# Patient Record
Sex: Male | Born: 1940 | Hispanic: No | Marital: Married | Smoking: Never smoker
Health system: Southern US, Community
[De-identification: ages and names within clinical notes are randomized; demographics above are authoritative.]

## PROBLEM LIST (undated history)

## (undated) DIAGNOSIS — I10 Essential (primary) hypertension: Secondary | ICD-10-CM

## (undated) DIAGNOSIS — K219 Gastro-esophageal reflux disease without esophagitis: Secondary | ICD-10-CM

## (undated) DIAGNOSIS — U071 COVID-19: Secondary | ICD-10-CM

## (undated) DIAGNOSIS — E785 Hyperlipidemia, unspecified: Secondary | ICD-10-CM

## (undated) DIAGNOSIS — I4891 Unspecified atrial fibrillation: Secondary | ICD-10-CM

---

## 2015-02-17 ENCOUNTER — Encounter (HOSPITAL_COMMUNITY): Payer: Self-pay | Admitting: *Deleted

## 2015-02-17 ENCOUNTER — Emergency Department (HOSPITAL_COMMUNITY)
Admission: EM | Admit: 2015-02-17 | Discharge: 2015-02-17 | Disposition: A | Discharge status: Home / Self Care | Payer: Medicaid Other | Attending: Emergency Medicine | Admitting: Emergency Medicine

## 2015-02-17 ENCOUNTER — Emergency Department (HOSPITAL_COMMUNITY): Payer: Medicaid Other

## 2015-02-17 DIAGNOSIS — Y998 Other external cause status: Secondary | ICD-10-CM | POA: Insufficient documentation

## 2015-02-17 DIAGNOSIS — R109 Unspecified abdominal pain: Secondary | ICD-10-CM

## 2015-02-17 DIAGNOSIS — Y9389 Activity, other specified: Secondary | ICD-10-CM | POA: Diagnosis not present

## 2015-02-17 DIAGNOSIS — S3991XA Unspecified injury of abdomen, initial encounter: Secondary | ICD-10-CM | POA: Insufficient documentation

## 2015-02-17 DIAGNOSIS — S20211A Contusion of right front wall of thorax, initial encounter: Secondary | ICD-10-CM | POA: Diagnosis not present

## 2015-02-17 DIAGNOSIS — S199XXA Unspecified injury of neck, initial encounter: Secondary | ICD-10-CM | POA: Insufficient documentation

## 2015-02-17 DIAGNOSIS — R0789 Other chest pain: Secondary | ICD-10-CM

## 2015-02-17 DIAGNOSIS — S3992XA Unspecified injury of lower back, initial encounter: Secondary | ICD-10-CM | POA: Insufficient documentation

## 2015-02-17 DIAGNOSIS — Y92410 Unspecified street and highway as the place of occurrence of the external cause: Secondary | ICD-10-CM | POA: Diagnosis not present

## 2015-02-17 DIAGNOSIS — S299XXA Unspecified injury of thorax, initial encounter: Secondary | ICD-10-CM | POA: Diagnosis present

## 2015-02-17 LAB — I-STAT CHEM 8, ED
BUN: 17 mg/dL (ref 6–20)
Calcium, Ion: 1.22 mmol/L (ref 1.13–1.30)
Chloride: 106 mmol/L (ref 101–111)
Creatinine, Ser: 1 mg/dL (ref 0.61–1.24)
GLUCOSE: 108 mg/dL — AB (ref 65–99)
HCT: 50 % (ref 39.0–52.0)
Hemoglobin: 17 g/dL (ref 13.0–17.0)
POTASSIUM: 3.7 mmol/L (ref 3.5–5.1)
Sodium: 141 mmol/L (ref 135–145)
TCO2: 23 mmol/L (ref 0–100)

## 2015-02-17 LAB — CBC WITH DIFFERENTIAL/PLATELET
Basophils Absolute: 0 10*3/uL (ref 0.0–0.1)
Basophils Relative: 0 % (ref 0–1)
EOS PCT: 1 % (ref 0–5)
Eosinophils Absolute: 0.1 10*3/uL (ref 0.0–0.7)
HCT: 46 % (ref 39.0–52.0)
HEMOGLOBIN: 15.6 g/dL (ref 13.0–17.0)
LYMPHS PCT: 12 % (ref 12–46)
Lymphs Abs: 1.6 10*3/uL (ref 0.7–4.0)
MCH: 28.4 pg (ref 26.0–34.0)
MCHC: 33.9 g/dL (ref 30.0–36.0)
MCV: 83.6 fL (ref 78.0–100.0)
MONO ABS: 1 10*3/uL (ref 0.1–1.0)
MONOS PCT: 7 % (ref 3–12)
Neutro Abs: 10.6 10*3/uL — ABNORMAL HIGH (ref 1.7–7.7)
Neutrophils Relative %: 80 % — ABNORMAL HIGH (ref 43–77)
Platelets: 196 10*3/uL (ref 150–400)
RBC: 5.5 MIL/uL (ref 4.22–5.81)
RDW: 14.5 % (ref 11.5–15.5)
WBC: 13.2 10*3/uL — ABNORMAL HIGH (ref 4.0–10.5)

## 2015-02-17 LAB — BASIC METABOLIC PANEL
ANION GAP: 8 (ref 5–15)
BUN: 14 mg/dL (ref 6–20)
CO2: 24 mmol/L (ref 22–32)
Calcium: 9.7 mg/dL (ref 8.9–10.3)
Chloride: 109 mmol/L (ref 101–111)
Creatinine, Ser: 1.03 mg/dL (ref 0.61–1.24)
GFR calc Af Amer: >60 mL/min (ref >60)
Glucose, Bld: 109 mg/dL — ABNORMAL HIGH (ref 65–99)
Potassium: 3.7 mmol/L (ref 3.5–5.1)
SODIUM: 141 mmol/L (ref 135–145)

## 2015-02-17 MED ORDER — IOHEXOL 300 MG/ML  SOLN
100.0000 mL | Freq: Once | INTRAMUSCULAR | Status: AC | PRN
  Administered 2015-02-17: 100 mL via INTRAVENOUS

## 2015-02-17 MED ORDER — MORPHINE SULFATE 4 MG/ML IJ SOLN
4.0000 mg | Freq: Once | INTRAMUSCULAR | Status: AC
  Administered 2015-02-17: 4 mg via INTRAVENOUS
  Filled 2015-02-17: qty 1

## 2015-02-17 MED ORDER — SODIUM CHLORIDE 0.9 % IV BOLUS (SEPSIS)
500.0000 mL | Freq: Once | INTRAVENOUS | Status: AC
  Administered 2015-02-17: 500 mL via INTRAVENOUS
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
## 2015-02-17 NOTE — ED Notes (Signed)
Pt is in stable condition upon d/c and is escorted from ED via wheelchair. 
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
## 2015-02-17 NOTE — ED Notes (Signed)
Patient transported to CT 

## 2015-02-17 NOTE — Discharge Instructions (Signed)
If you were given medicines take as directed.  If you are on coumadin or contraceptives realize their levels and effectiveness is altered by many different medicines.  If you have any reaction (rash, tongues swelling, other) to the medicines stop taking and see a physician.    If your blood pressure was elevated in the ER make sure you follow up for management with a primary doctor or return for chest pain, shortness of breath or stroke symptoms.  Please follow up as directed and return to the ER or see a physician for new or worsening symptoms.  Thank you. Filed Vitals:   02/17/15 1228 02/17/15 1231 02/17/15 1245 02/17/15 1300  BP:   149/92 141/85  Pulse:   105 92  Temp:  97.7 F (36.5 C)    TempSrc:  Oral    Height:   (1.651 m)    Weight:  160 lb (72.576 kg)    SpO2: 96%  98% 95%

## 2019-04-25 ENCOUNTER — Other Ambulatory Visit: Payer: Self-pay

## 2019-04-25 DIAGNOSIS — Z20822 Contact with and (suspected) exposure to covid-19: Secondary | ICD-10-CM

## 2019-04-26 LAB — NOVEL CORONAVIRUS, NAA: SARS-CoV-2, NAA: NOT DETECTED

## 2019-06-21 ENCOUNTER — Encounter (HOSPITAL_COMMUNITY): Payer: Self-pay | Admitting: Internal Medicine

## 2019-06-21 ENCOUNTER — Inpatient Hospital Stay (HOSPITAL_COMMUNITY)
Admission: EM | Admit: 2019-06-21 | Discharge: 2019-06-26 | DRG: 177 | Disposition: A | Discharge status: Home / Self Care | Payer: Medicaid Other | Attending: Internal Medicine | Admitting: Internal Medicine

## 2019-06-21 ENCOUNTER — Emergency Department (HOSPITAL_COMMUNITY): Payer: Medicaid Other

## 2019-06-21 ENCOUNTER — Other Ambulatory Visit: Payer: Self-pay

## 2019-06-21 DIAGNOSIS — E785 Hyperlipidemia, unspecified: Secondary | ICD-10-CM | POA: Diagnosis present

## 2019-06-21 DIAGNOSIS — E860 Dehydration: Secondary | ICD-10-CM | POA: Diagnosis present

## 2019-06-21 DIAGNOSIS — J1282 Pneumonia due to coronavirus disease 2019: Secondary | ICD-10-CM

## 2019-06-21 DIAGNOSIS — J1289 Other viral pneumonia: Secondary | ICD-10-CM | POA: Diagnosis present

## 2019-06-21 DIAGNOSIS — R0902 Hypoxemia: Secondary | ICD-10-CM

## 2019-06-21 DIAGNOSIS — J069 Acute upper respiratory infection, unspecified: Secondary | ICD-10-CM | POA: Diagnosis present

## 2019-06-21 DIAGNOSIS — Z79899 Other long term (current) drug therapy: Secondary | ICD-10-CM

## 2019-06-21 DIAGNOSIS — K219 Gastro-esophageal reflux disease without esophagitis: Secondary | ICD-10-CM | POA: Diagnosis present

## 2019-06-21 DIAGNOSIS — R001 Bradycardia, unspecified: Secondary | ICD-10-CM | POA: Diagnosis present

## 2019-06-21 DIAGNOSIS — U071 COVID-19: Secondary | ICD-10-CM | POA: Diagnosis present

## 2019-06-21 DIAGNOSIS — I480 Paroxysmal atrial fibrillation: Secondary | ICD-10-CM | POA: Diagnosis not present

## 2019-06-21 DIAGNOSIS — E1169 Type 2 diabetes mellitus with other specified complication: Secondary | ICD-10-CM

## 2019-06-21 DIAGNOSIS — E871 Hypo-osmolality and hyponatremia: Secondary | ICD-10-CM | POA: Diagnosis present

## 2019-06-21 DIAGNOSIS — R0600 Dyspnea, unspecified: Secondary | ICD-10-CM

## 2019-06-21 DIAGNOSIS — I10 Essential (primary) hypertension: Secondary | ICD-10-CM | POA: Diagnosis present

## 2019-06-21 DIAGNOSIS — J189 Pneumonia, unspecified organism: Secondary | ICD-10-CM

## 2019-06-21 DIAGNOSIS — R9431 Abnormal electrocardiogram [ECG] [EKG]: Secondary | ICD-10-CM | POA: Diagnosis not present

## 2019-06-21 DIAGNOSIS — I4891 Unspecified atrial fibrillation: Secondary | ICD-10-CM | POA: Diagnosis present

## 2019-06-21 DIAGNOSIS — J9601 Acute respiratory failure with hypoxia: Secondary | ICD-10-CM | POA: Diagnosis present

## 2019-06-21 HISTORY — DX: Essential (primary) hypertension: I10

## 2019-06-21 HISTORY — DX: Gastro-esophageal reflux disease without esophagitis: K21.9

## 2019-06-21 HISTORY — DX: Hyperlipidemia, unspecified: E78.5

## 2019-06-21 LAB — CBC WITH DIFFERENTIAL/PLATELET
Abs Immature Granulocytes: 0.09 10*3/uL — ABNORMAL HIGH (ref 0.00–0.07)
Basophils Absolute: 0 10*3/uL (ref 0.0–0.1)
Basophils Relative: 1 %
Eosinophils Absolute: 0 10*3/uL (ref 0.0–0.5)
Eosinophils Relative: 0 %
HCT: 51.4 % (ref 39.0–52.0)
Hemoglobin: 16.8 g/dL (ref 13.0–17.0)
Immature Granulocytes: 1 %
Lymphocytes Relative: 18 %
Lymphs Abs: 1.4 10*3/uL (ref 0.7–4.0)
MCH: 27.2 pg (ref 26.0–34.0)
MCHC: 32.7 g/dL (ref 30.0–36.0)
MCV: 83.3 fL (ref 80.0–100.0)
Monocytes Absolute: 0.9 10*3/uL (ref 0.1–1.0)
Monocytes Relative: 12 %
Neutro Abs: 5.1 10*3/uL (ref 1.7–7.7)
Neutrophils Relative %: 68 %
Platelets: 160 10*3/uL (ref 150–400)
RBC: 6.17 MIL/uL — ABNORMAL HIGH (ref 4.22–5.81)
RDW: 13.8 % (ref 11.5–15.5)
WBC: 7.5 10*3/uL (ref 4.0–10.5)
nRBC: 0 % (ref 0.0–0.2)

## 2019-06-21 LAB — COMPREHENSIVE METABOLIC PANEL
ALT: 63 U/L — ABNORMAL HIGH (ref 0–44)
AST: 54 U/L — ABNORMAL HIGH (ref 15–41)
Albumin: 3.7 g/dL (ref 3.5–5.0)
Alkaline Phosphatase: 45 U/L (ref 38–126)
Anion gap: 11 (ref 5–15)
BUN: 21 mg/dL (ref 8–23)
CO2: 18 mmol/L — ABNORMAL LOW (ref 22–32)
Calcium: 8.3 mg/dL — ABNORMAL LOW (ref 8.9–10.3)
Chloride: 100 mmol/L (ref 98–111)
Creatinine, Ser: 0.78 mg/dL (ref 0.61–1.24)
GFR calc Af Amer: >60 mL/min (ref >60)
GFR calc non Af Amer: >60 mL/min (ref >60)
Glucose, Bld: 123 mg/dL — ABNORMAL HIGH (ref 70–99)
Potassium: 3.6 mmol/L (ref 3.5–5.1)
Sodium: 129 mmol/L — ABNORMAL LOW (ref 135–145)
Total Bilirubin: 1.2 mg/dL (ref 0.3–1.2)
Total Protein: 7.6 g/dL (ref 6.5–8.1)

## 2019-06-21 LAB — TRIGLYCERIDES: Triglycerides: 157 mg/dL — ABNORMAL HIGH (ref <150)

## 2019-06-21 LAB — GLUCOSE, CAPILLARY
Glucose-Capillary: 146 mg/dL — ABNORMAL HIGH (ref 70–99)
Glucose-Capillary: 146 mg/dL — ABNORMAL HIGH (ref 70–99)

## 2019-06-21 LAB — D-DIMER, QUANTITATIVE: D-Dimer, Quant: 1 ug/mL-FEU — ABNORMAL HIGH (ref 0.00–0.50)

## 2019-06-21 LAB — FERRITIN: Ferritin: 410 ng/mL — ABNORMAL HIGH (ref 24–336)

## 2019-06-21 LAB — LACTIC ACID, PLASMA: Lactic Acid, Venous: 1.3 mmol/L (ref 0.5–1.9)

## 2019-06-21 LAB — LACTATE DEHYDROGENASE: LDH: 242 U/L — ABNORMAL HIGH (ref 98–192)

## 2019-06-21 LAB — POC SARS CORONAVIRUS 2 AG -  ED: SARS Coronavirus 2 Ag: POSITIVE — AB

## 2019-06-21 LAB — PROCALCITONIN: Procalcitonin: <0.1 ng/mL

## 2019-06-21 LAB — C-REACTIVE PROTEIN: CRP: 8.4 mg/dL — ABNORMAL HIGH (ref <1.0)

## 2019-06-21 LAB — FIBRINOGEN: Fibrinogen: 608 mg/dL — ABNORMAL HIGH (ref 210–475)

## 2019-06-21 LAB — LIPASE, BLOOD: Lipase: 29 U/L (ref 11–51)

## 2019-06-21 MED ORDER — GUAIFENESIN ER 600 MG PO TB12
1200.0000 mg | ORAL_TABLET | Freq: Two times a day (BID) | ORAL | Status: DC
  Administered 2019-06-21 – 2019-06-26 (×10): 1200 mg via ORAL
  Filled 2019-06-21 (×11): qty 2

## 2019-06-21 MED ORDER — MORPHINE SULFATE (PF) 4 MG/ML IV SOLN
4.0000 mg | Freq: Once | INTRAVENOUS | Status: AC
  Administered 2019-06-21: 4 mg via INTRAVENOUS
  Filled 2019-06-21: qty 1

## 2019-06-21 MED ORDER — INSULIN ASPART 100 UNIT/ML ~~LOC~~ SOLN
0.0000 [IU] | Freq: Three times a day (TID) | SUBCUTANEOUS | Status: DC
  Administered 2019-06-21 – 2019-06-22 (×3): 3 [IU] via SUBCUTANEOUS
  Filled 2019-06-21: qty 0.2

## 2019-06-21 MED ORDER — ZINC SULFATE 220 (50 ZN) MG PO CAPS
220.0000 mg | ORAL_CAPSULE | Freq: Every day | ORAL | Status: DC
  Administered 2019-06-21 – 2019-06-26 (×6): 220 mg via ORAL
  Filled 2019-06-21 (×6): qty 1

## 2019-06-21 MED ORDER — METHYLPREDNISOLONE SODIUM SUCC 125 MG IJ SOLR
60.0000 mg | Freq: Two times a day (BID) | INTRAMUSCULAR | Status: DC
  Administered 2019-06-21 – 2019-06-26 (×10): 60 mg via INTRAVENOUS
  Filled 2019-06-21 (×10): qty 2

## 2019-06-21 MED ORDER — SENNOSIDES-DOCUSATE SODIUM 8.6-50 MG PO TABS: 1.0000 | ORAL_TABLET | Freq: Every evening | ORAL | Status: DC | PRN

## 2019-06-21 MED ORDER — ENOXAPARIN SODIUM 40 MG/0.4ML ~~LOC~~ SOLN
40.0000 mg | SUBCUTANEOUS | Status: DC
  Administered 2019-06-21 – 2019-06-25 (×5): 40 mg via SUBCUTANEOUS
  Filled 2019-06-21 (×5): qty 0.4

## 2019-06-21 MED ORDER — TRAMADOL HCL 50 MG PO TABS: 50.0000 mg | ORAL_TABLET | Freq: Four times a day (QID) | ORAL | Status: DC | PRN

## 2019-06-21 MED ORDER — SODIUM CHLORIDE 0.9 % IV SOLN
100.0000 mg | INTRAVENOUS | Status: AC
  Administered 2019-06-22 – 2019-06-25 (×4): 100 mg via INTRAVENOUS
  Filled 2019-06-21 (×2): qty 20
  Filled 2019-06-21 (×2): qty 100

## 2019-06-21 MED ORDER — SODIUM CHLORIDE 0.9 % IV BOLUS
500.0000 mL | Freq: Once | INTRAVENOUS | Status: AC
  Administered 2019-06-21: 500 mL via INTRAVENOUS

## 2019-06-21 MED ORDER — SODIUM CHLORIDE 0.9 % IV SOLN
INTRAVENOUS | Status: DC
  Administered 2019-06-21: 17:00:00 via INTRAVENOUS

## 2019-06-21 MED ORDER — HYDROCOD POLST-CPM POLST ER 10-8 MG/5ML PO SUER: 5.0000 mL | Freq: Two times a day (BID) | ORAL | Status: DC | PRN

## 2019-06-21 MED ORDER — ACETAMINOPHEN 325 MG PO TABS
650.0000 mg | ORAL_TABLET | Freq: Four times a day (QID) | ORAL | Status: DC | PRN
  Administered 2019-06-21 – 2019-06-24 (×3): 650 mg via ORAL
  Filled 2019-06-21 (×3): qty 2

## 2019-06-21 MED ORDER — SODIUM CHLORIDE 0.9 % IV SOLN
INTRAVENOUS | Status: DC
  Administered 2019-06-21: 18:00:00 via INTRAVENOUS

## 2019-06-21 MED ORDER — LORATADINE 10 MG PO TABS: 10.0000 mg | ORAL_TABLET | Freq: Every day | ORAL | Status: DC | PRN

## 2019-06-21 MED ORDER — ONDANSETRON HCL 4 MG/2ML IJ SOLN
4.0000 mg | Freq: Once | INTRAMUSCULAR | Status: AC
  Administered 2019-06-21: 4 mg via INTRAVENOUS
  Filled 2019-06-21: qty 2

## 2019-06-21 MED ORDER — SODIUM CHLORIDE 0.9 % IV SOLN
200.0000 mg | Freq: Once | INTRAVENOUS | Status: AC
  Administered 2019-06-21: 200 mg via INTRAVENOUS
  Filled 2019-06-21: qty 40

## 2019-06-21 MED ORDER — ONDANSETRON HCL 4 MG PO TABS: 4.0000 mg | ORAL_TABLET | Freq: Four times a day (QID) | ORAL | Status: DC | PRN

## 2019-06-21 MED ORDER — SORBITOL 70 % SOLN: 30.0000 mL | Freq: Every day | Status: DC | PRN

## 2019-06-21 MED ORDER — ONDANSETRON HCL 4 MG/2ML IJ SOLN
4.0000 mg | Freq: Four times a day (QID) | INTRAMUSCULAR | Status: DC | PRN
  Filled 2019-06-21: qty 2

## 2019-06-21 MED ORDER — GUAIFENESIN-DM 100-10 MG/5ML PO SYRP
10.0000 mL | ORAL_SOLUTION | ORAL | Status: DC | PRN
  Administered 2019-06-21 – 2019-06-22 (×2): 10 mL via ORAL
  Filled 2019-06-21 (×2): qty 10

## 2019-06-21 MED ORDER — PANTOPRAZOLE SODIUM 40 MG PO TBEC
40.0000 mg | DELAYED_RELEASE_TABLET | Freq: Every day | ORAL | Status: DC
  Administered 2019-06-22 – 2019-06-26 (×5): 40 mg via ORAL
  Filled 2019-06-21 (×5): qty 1

## 2019-06-21 MED ORDER — FENOFIBRATE 160 MG PO TABS
160.0000 mg | ORAL_TABLET | Freq: Every day | ORAL | Status: DC
  Administered 2019-06-21 – 2019-06-26 (×6): 160 mg via ORAL
  Filled 2019-06-21 (×6): qty 1

## 2019-06-21 MED ORDER — DEXAMETHASONE SODIUM PHOSPHATE 10 MG/ML IJ SOLN
8.0000 mg | Freq: Once | INTRAMUSCULAR | Status: AC
  Administered 2019-06-21: 8 mg via INTRAVENOUS
  Filled 2019-06-21: qty 1

## 2019-06-21 MED ORDER — VITAMIN D3 25 MCG (1000 UNIT) PO TABS
1000.0000 [IU] | ORAL_TABLET | Freq: Every day | ORAL | Status: DC
  Administered 2019-06-21 – 2019-06-26 (×6): 1000 [IU] via ORAL
  Filled 2019-06-21 (×9): qty 1

## 2019-06-21 MED ORDER — AMLODIPINE BESYLATE 5 MG PO TABS
2.5000 mg | ORAL_TABLET | Freq: Two times a day (BID) | ORAL | Status: DC
  Administered 2019-06-21 – 2019-06-26 (×9): 2.5 mg via ORAL
  Filled 2019-06-21 (×10): qty 1

## 2019-06-21 MED ORDER — VITAMIN C 500 MG PO TABS
500.0000 mg | ORAL_TABLET | Freq: Every day | ORAL | Status: DC
  Administered 2019-06-21 – 2019-06-26 (×6): 500 mg via ORAL
  Filled 2019-06-21 (×6): qty 1

## 2019-06-21 MED ORDER — IPRATROPIUM-ALBUTEROL 20-100 MCG/ACT IN AERS
1.0000 | INHALATION_SPRAY | Freq: Four times a day (QID) | RESPIRATORY_TRACT | Status: DC
  Administered 2019-06-22 – 2019-06-26 (×13): 1 via RESPIRATORY_TRACT
  Filled 2019-06-21 (×2): qty 4

## 2019-06-21 NOTE — ED Notes (Signed)
Hospitalist at bedside 

## 2019-06-21 NOTE — ED Notes (Signed)
Patient placed on 4L [place] per Dr. Grandville Silos.

## 2019-06-21 NOTE — ED Notes (Signed)
Pt given water and saltines and able to keep it down without difficulty

## 2019-06-21 NOTE — Discharge Instructions (Addendum)
Person Under Monitoring Name: Michael Steele  Location: [place] [address]   Infection Prevention Recommendations for Individuals Confirmed to have, or Being Evaluated for, 2019 Novel Coronavirus (COVID-19) Infection Who Receive Care at Home  Individuals who are confirmed to have, or are being evaluated for, COVID-19 should follow the prevention steps below until a healthcare provider or local or state health department says they can return to normal activities.  Stay home except to get medical care You should restrict activities outside your home, except for getting medical care. Do not go to work, school, or public areas, and do not use public transportation or taxis.  Call ahead before visiting your doctor Before your medical appointment, call the healthcare provider and tell them that you have, or are being evaluated for, COVID-19 infection. This will help the healthcare providers office take steps to keep other people from getting infected. Ask your healthcare provider to call the local or state health department.  Monitor your symptoms Seek prompt medical attention if your illness is worsening (e.g., difficulty breathing). Before going to your medical appointment, call the healthcare provider and tell them that you have, or are being evaluated for, COVID-19 infection. Ask your healthcare provider to call the local or state health department.  Wear a facemask You should wear a facemask that covers your nose and mouth when you are in the same room with other people and when you visit a healthcare provider. People who live with or visit you should also wear a facemask while they are in the same room with you.  Separate yourself from other people in your home As much as possible, you should stay in a different room from other people in your home. Also, you should use a separate bathroom, if available.  Avoid sharing household items You should  not share dishes, drinking glasses, cups, eating utensils, towels, bedding, or other items with other people in your home. After using these items, you should wash them thoroughly with soap and water.  Cover your coughs and sneezes Cover your mouth and nose with a tissue when you cough or sneeze, or you can cough or sneeze into your sleeve. Throw used tissues in a lined trash can, and immediately wash your hands with soap and water for at least 20 seconds or use an alcohol-based hand rub.  Wash your [facility] your hands often and thoroughly with soap and water for at least 20 seconds. You can use an alcohol-based hand sanitizer if soap and water are not available and if your hands are not visibly dirty. Avoid touching your eyes, nose, and mouth with unwashed hands.   Prevention Steps for Caregivers and Household Members of Individuals Confirmed to have, or Being Evaluated for, COVID-19 Infection Being Cared for in the Home  If you live with, or provide care at home for, a person confirmed to have, or being evaluated for, COVID-19 infection please follow these guidelines to prevent infection:  Follow healthcare providers instructions Make sure that you understand and can help the patient follow any healthcare provider instructions for all care.  Provide for the patients basic needs You should help the patient with basic needs in the home and provide support for getting groceries, prescriptions, and other personal needs.  Monitor the patients symptoms If they are getting sicker, call his or her medical provider and tell them that the patient has, or is being evaluated for, COVID-19 infection. This will help the healthcare providers office  take steps to keep other people from getting infected. Ask the healthcare provider to call the local or state health department.  Limit the number of people who have contact with the patient  If possible, have only one caregiver for the  patient.  Other household members should stay in another home or place of residence. If this is not possible, they should stay  in another room, or be separated from the patient as much as possible. Use a separate bathroom, if available.  Restrict visitors who do not have an essential need to be in the home.  Keep older adults, very young children, and other sick people away from the patient Keep older adults, very young children, and those who have compromised immune systems or chronic health conditions away from the patient. This includes people with chronic heart, lung, or kidney conditions, diabetes, and cancer.  Ensure good ventilation Make sure that shared spaces in the home have good air flow, such as from an air conditioner or an opened window, weather permitting.  Wash your hands often  Wash your hands often and thoroughly with soap and water for at least 20 seconds. You can use an alcohol based hand sanitizer if soap and water are not available and if your hands are not visibly dirty.  Avoid touching your eyes, nose, and mouth with unwashed hands.  Use disposable paper towels to dry your hands. If not available, use dedicated cloth towels and replace them when they become wet.  Wear a facemask and gloves  Wear a disposable facemask at all times in the room and gloves when you touch or have contact with the patients blood, body fluids, and/or secretions or excretions, such as sweat, saliva, sputum, nasal mucus, vomit, urine, or feces.  Ensure the mask fits over your nose and mouth tightly, and do not touch it during use.  Throw out disposable facemasks and gloves after using them. Do not reuse.  Wash your hands immediately after removing your facemask and gloves.  If your personal clothing becomes contaminated, carefully remove clothing and launder. Wash your hands after handling contaminated clothing.  Place all used disposable facemasks, gloves, and other waste in a lined  container before disposing them with other household waste.  Remove gloves and wash your hands immediately after handling these items.  Do not share dishes, glasses, or other household items with the patient  Avoid sharing household items. You should not share dishes, drinking glasses, cups, eating utensils, towels, bedding, or other items with a patient who is confirmed to have, or being evaluated for, COVID-19 infection.  After the person uses these items, you should wash them thoroughly with soap and water.  Wash laundry thoroughly  Immediately remove and wash clothes or bedding that have blood, body fluids, and/or secretions or excretions, such as sweat, saliva, sputum, nasal mucus, vomit, urine, or feces, on them.  Wear gloves when handling laundry from the patient.  Read and follow directions on labels of laundry or clothing items and detergent. In general, wash and dry with the warmest temperatures recommended on the label.  Clean all areas the individual has used often  Clean all touchable surfaces, such as counters, tabletops, doorknobs, bathroom fixtures, toilets, phones, keyboards, tablets, and bedside tables, every day. Also, clean any surfaces that may have blood, body fluids, and/or secretions or excretions on them.  Wear gloves when cleaning surfaces the patient has come in contact with.  Use a diluted bleach solution (e.g., dilute bleach with 1 part  bleach and 10 parts water) or a household disinfectant with a label that says EPA-registered for coronaviruses. To make a bleach solution at home, add 1 tablespoon of bleach to 1 quart (4 cups) of water. For a larger supply, add  cup of bleach to 1 gallon (16 cups) of water.  Read labels of cleaning products and follow recommendations provided on product labels. Labels contain instructions for safe and effective use of the cleaning product including precautions you should take when applying the product, such as wearing gloves or  eye protection and making sure you have good ventilation during use of the product.  Remove gloves and wash hands immediately after cleaning.  Monitor yourself for signs and symptoms of illness Caregivers and household members are considered close contacts, should monitor their health, and will be asked to limit movement outside of the home to the extent possible. Follow the monitoring steps for close contacts listed on the symptom monitoring form.   ? If you have additional questions, contact your local health department or call the epidemiologist on call at [phone] (available 24/7). ? This guidance is subject to change. For the most up-to-date guidance from [facility], please refer to their website: [email or web address]

## 2019-06-21 NOTE — H&P (Signed)
History and Physical    Michael Steele QPR:[id number] DOB: 1941/05/19 DOA: 06/21/2019  PCP: [facility] (Inactive) Patient coming from: Home  I have personally briefly reviewed patient's old medical records in [facility]  Chief Complaint: Worsening shortness of breath/recently diagnosed with Covid on 06/14/2019 per patient and wife.   HPI: Michael Steele is a 78 y.o. male with medical history significant of hypertension, hyperlipidemia, GERD, recently diagnosed with Covid 19+ per patient 06/14/2019 at [facility] per ED physician. Patient's wife feels he got exposed on 12 or 13 of November as patient's daughter came to visit from school in [place] and returned back to school in [place] where she tested positive for COVID-19 on 06/11/2019. Patient stated initial 5 days he remained pretty much asymptomatic with no significant issues however over the past 4-5 days has had gradual worsening shortness of breath,fatigue, increased work of breathing, epigastric abdominal pain, nonproductive cough.  Patient denies any fevers, no chills, no nausea or vomiting, no diarrhea constipation, no dizziness, no lightheadedness, no dysuria, no melena, no hematemesis, no hematochezia.  Patient does state that his abdominal pain that he had on presentation has currently resolved in the ED.  Patient stated initially had some chest pain however this has subsequently since resolved.  ED Course: Patient seen in the ED, noted to be tachycardic with tachypnea with a respiratory rate of 36, patient also noted to have sats of 86 to 87% on room air with minimal activity with shortness of breath on minimal activity and increased work of breathing on minimal exertion.  Chest x-ray done concerning for left lower lobe pneumonia.  Comprehensive metabolic profile with a sodium of 129, bicarb of 18, glucose of 123, calcium of 8.3, AST of 54, ALT of 63.  LDH noted to be elevated at 242.  Triglycerides are  157.  Ferritin elevated at 410, CRP elevated at 8.4.  D-dimer elevated at 1.  Procalcitonin less than 0.10.  Fibrinogen elevated at 608.  CBC unremarkable.  Point-of-care COVID-19 test positive.  SARS Covid 2 pending.  Review of Systems: As per HPI otherwise 10 point review of systems negative.   Past Medical History:  Diagnosis Date   GERD (gastroesophageal reflux disease)    Hyperlipidemia    Hypertension     No past surgical history on file.   reports that he has never smoked. He does not have any smokeless tobacco history on file. He reports current alcohol use. He reports that he does not use drugs.  No Known Allergies  No family history on file.   Per patient mother died at age 6/76 from old age.  Father died in his 72s from old age.  Prior to Admission medications   Medication Sig Start Date End Date Taking? Authorizing Provider  amLODipine (NORVASC) 2.5 MG tablet Take 2.5 mg by mouth 2 (two) times daily. 06/07/19  Yes [provider]  Ascorbic Acid (VITAMIN C PO) Take 1 tablet by mouth daily.   Yes [provider]  azithromycin (ZITHROMAX) 250 MG tablet Take 250-500 mg by mouth See admin instructions. Take 500mg  on day 1, then 250mg  on days 2-5. 06/14/19  Yes [provider]  cetirizine (ZYRTEC) 5 MG tablet Take 5 mg by mouth daily as needed for allergies.   Yes [provider]  omeprazole (PRILOSEC) 20 MG capsule Take 20 mg by mouth daily. 05/25/19  Yes [provider]  predniSONE (DELTASONE) 20 MG tablet Take 20 mg by  mouth daily. 06/19/19  Yes [provider]  VITAMIN D PO Take 1 capsule by mouth daily.   Yes [provider]  fenofibrate (TRICOR) 48 MG tablet Take 48 mg by mouth daily. 06/07/19   [provider]    Physical Exam: Vitals:   06/21/19 1600 06/21/19 1630 06/21/19 1657 06/21/19 1700  BP: 130/81 129/80  117/81  Pulse: 97 89 84 83  Resp: (!) 26 (!) 24 20 (!) 23  Temp:      TempSrc:       SpO2: 94% 94% 96% 96%    Constitutional: NAD, calm, comfortable Vitals:   06/21/19 1600 06/21/19 1630 06/21/19 1657 06/21/19 1700  BP: 130/81 129/80  117/81  Pulse: 97 89 84 83  Resp: (!) 26 (!) 24 20 (!) 23  Temp:      TempSrc:      SpO2: 94% 94% 96% 96%   Eyes: PERRL, lids and conjunctivae normal ENMT: Mucous membranes are dry. Posterior pharynx clear of any exudate or lesions.Normal dentition.  Neck: normal, supple, no masses, no thyromegaly Respiratory: Poor to fair air movement.  Diminished breath sounds in the bases.  Some coarse breath sounds in the left base.  No wheezing noted.  No crackles noted.  Speaking in full sentences.  No significant use of accessory muscles of respiration at rest.  Cardiovascular:.  No murmurs rubs or gallops.  No JVD.  No lower extremity edema.  No carotid bruits.  Abdomen: no tenderness, no masses palpated. No hepatosplenomegaly. Bowel sounds positive.  Musculoskeletal: no clubbing / cyanosis. No joint deformity upper and lower extremities. Good ROM, no contractures. Normal muscle tone.  Skin: no rashes, lesions, ulcers. No induration Neurologic: CN 2-12 grossly intact. Sensation intact, DTR normal. Strength 5/5 in all 4.  Psychiatric: Normal judgment and insight. Alert and oriented x 3. Normal mood.   Labs on Admission: I have personally reviewed following labs and imaging studies  CBC: Recent Labs  Lab 06/21/19 1146  WBC 7.5  NEUTROABS 5.1  HGB 16.8  HCT 51.4  MCV 83.3  PLT 160   Basic Metabolic Panel: Recent Labs  Lab 06/21/19 1146  NA 129*  K 3.6  CL 100  CO2 18*  GLUCOSE 123*  BUN 21  CREATININE 0.78  CALCIUM 8.3*   GFR: CrCl cannot be calculated (Unknown ideal weight.). Liver Function Tests: Recent Labs  Lab 06/21/19 1146  AST 54*  ALT 63*  ALKPHOS 45  BILITOT 1.2  PROT 7.6  ALBUMIN 3.7   Recent Labs  Lab 06/21/19 1146  LIPASE 29   No results for input(s): AMMONIA in the last 168 hours. Coagulation  Profile: No results for input(s): INR, PROTIME in the last 168 hours. Cardiac Enzymes: No results for input(s): CKTOTAL, CKMB, CKMBINDEX, TROPONINI in the last 168 hours. BNP (last 3 results) No results for input(s): PROBNP in the last 8760 hours. HbA1C: No results for input(s): HGBA1C in the last 72 hours. CBG: No results for input(s): GLUCAP in the last 168 hours. Lipid Profile: Recent Labs    06/21/19 1146  TRIG 157*   Thyroid Function Tests: No results for input(s): TSH, T4TOTAL, FREET4, T3FREE, THYROIDAB in the last 72 hours. Anemia Panel: Recent Labs    06/21/19 1146  FERRITIN 410*   Urine analysis: No results found for: COLORURINE, APPEARANCEUR, LABSPEC, PHURINE, GLUCOSEU, HGBUR, BILIRUBINUR, KETONESUR, PROTEINUR, UROBILINOGEN, NITRITE, LEUKOCYTESUR  Radiological Exams on Admission: Dg Chest Portable 1 View  Result Date: 06/21/2019 CLINICAL DATA:  Patient presents  to the ED with complaint of SOB. Patient is reportedly having orthopnea that is relieved by sitting up. Patient reports he was started on prednisone and antibiotics but self stopped 3 days in. Patient reports he COVID positive COVID+, SOB EXAM: PORTABLE CHEST 1 VIEW COMPARISON:  Chest radiograph 03/03/2015 FINDINGS: Stable cardiac silhouette. There is fine airspace disease in the LEFT lower lobe. RIGHT lung clear. LEFT upper lobe clear. No pneumothorax. No pulmonary edema IMPRESSION: LEFT lower lobe pneumonia. Electronically Signed   By: Suzy Bouchard M.D.   On: 06/21/2019 11:54    EKG: Independently reviewed.  Sinus tachycardia with occasional PVCs.  No ischemic changes noted.  Assessment/Plan Principal Problem:   Acute hypoxemic respiratory failure due to COVID-19 [facility]) Active Problems:   Acute respiratory disease due to COVID-19 virus   Hyponatremia   Dehydration   HTN (hypertension)   Hyperlipidemia   GERD (gastroesophageal reflux disease)   1 acute hypoxemic respiratory failure due to COVID-19  pneumonitis/pneumonia Patient tested positive for Covid 19 on 06/14/2019 and wife at [facility].  Per wife patient likely may have been exposed on November 12 or 13 2020 as daughter came to visit from school and returned back to [place] where she tested positive for COVID-19 on June 11, 2019. Unable to obtain results from [facility] on day of admission.  Patient presented with a 5-day history of worsening shortness of breath, increased work of breathing, nonproductive cough, hypoxic with sats of 80s on minimal exertion and with increased work of breathing on minimal exertion.  D-dimer elevated, CRP elevated, LDH elevated, ferritin elevated.  Procalcitonin less than 0.10.  Point-of-care SARS-CoV-2 positive.  SARS-CoV-2 PCR pending.  Chest x-ray with left lower lobe infiltrate.  Patient received a dose of IV Decadron in the ED.  Will place on Solu-Medrol 60 mg IV every 12 hours, IV remdesivir, Mucinex, Combivent MDI every 6 hours, vitamin C, zinc, IV fluids, supportive care.  Pulmonary toilet.  2.  Hypertension Resume home regimen of Norvasc 2.5 mg daily.  3.  Dehydration IV fluids.  4.  Gastroesophageal reflux disease PPI.  5.  Hyperlipidemia Resume home regimen of fenofibrate.  6.  Hyponatremia Likely secondary to volume depletion.  Hydrated IV fluids.  Follow.  DVT prophylaxis: Lovenox Code Status: Full Family Communication: Updated patient.  Updated wife via telephone.   Disposition Plan: Likely home when clinically improved. Consults called: None Admission status: Admit to inpatient/airborne and contact precautions/telemetry  Time spent 60 minutes   Irine Seal MD [facility]  If 7PM-7AM, please contact night-coverage [email or web address]  06/21/2019, 5:25 PM

## 2019-06-21 NOTE — ED Provider Notes (Addendum)
[facility]-EMERGENCY DEPT Provider Note   CSN: [id number] Arrival date & time: 06/21/19  1051     History   Chief Complaint Chief Complaint  Patient presents with  . Shortness of Breath  . COVID +    HPI Michael Steele is a 78 y.o. male.     Patient with recent covid+ test, and covid symptoms for past 10 days, c/o increased wob, and epigastric pain. Symptoms acute onset, moderate, persistent, felt worse in past 1-2 days. Nausea. No vomiting. Had normal bm this AM. Denies abd distension. Epigastric pain, moderate, constant, persistent, non radiating. No dysuria ur gu c/o. Episodic cough, persistent, not acutely worse, non productive. No back or flank pain.   The history is provided by the patient.  Shortness of Breath Associated symptoms: abdominal pain, cough and fever   Associated symptoms: no chest pain, no headaches, no neck pain, no rash and no sore throat     No past medical history on file.  There are no active problems to display for this patient.   No past surgical history on file.      Home Medications    Prior to Admission medications   Not on File    Family History No family history on file.  Social History Social History   Tobacco Use  . Smoking status: Not on file  Substance Use Topics  . Alcohol use: Not on file  . Drug use: Not on file     Allergies   Patient has no known allergies.   Review of Systems Review of Systems  Constitutional: Positive for fever.  HENT: Negative for sore throat.   Eyes: Negative for redness.  Respiratory: Positive for cough and shortness of breath.   Cardiovascular: Negative for chest pain.  Gastrointestinal: Positive for abdominal pain and nausea.  Endocrine: Negative for polyuria.  Genitourinary: Negative for dysuria and flank pain.  Musculoskeletal: Negative for back pain, neck pain and neck stiffness.  Skin: Negative for rash.  Neurological: Negative for headaches.   Hematological: Does not bruise/bleed easily.  Psychiatric/Behavioral: Negative for confusion.     Physical Exam Updated Vital Signs BP (!) 139/93   Pulse 94   Temp 98.7 F (37.1 C) (Oral)   Resp (!) 32   SpO2 95%   Physical Exam Vitals signs and nursing note reviewed.  Constitutional:      Appearance: Normal appearance. He is well-developed.  HENT:     Head: Atraumatic.     Nose: Nose normal.     Mouth/Throat:     Mouth: Mucous membranes are moist.     Pharynx: Oropharynx is clear.  Eyes:     General: No scleral icterus.    Conjunctiva/sclera: Conjunctivae normal.  Neck:     Musculoskeletal: Normal range of motion and neck supple. No neck rigidity.     Trachea: No tracheal deviation.  Cardiovascular:     Rate and Rhythm: Normal rate and regular rhythm.     Pulses: Normal pulses.     Heart sounds: Normal heart sounds. No murmur. No friction rub. No gallop.   Pulmonary:     Effort: Pulmonary effort is normal. No accessory muscle usage or respiratory distress.     Breath sounds: Normal breath sounds.  Abdominal:     General: Bowel sounds are normal. There is no distension.     Palpations: Abdomen is soft. There is no mass.     Tenderness: There is no abdominal tenderness. There is no guarding  or rebound.     Hernia: No hernia is present.  Genitourinary:    Comments: No cva tenderness. Musculoskeletal:        General: No swelling or tenderness.  Skin:    General: Skin is warm and dry.     Findings: No rash.  Neurological:     Mental Status: He is alert.     Comments: Alert, speech clear.   Psychiatric:        Mood and Affect: Mood normal.      ED Treatments / Results  Labs (all labs ordered are listed, but only abnormal results are displayed) Results for orders placed or performed during the hospital encounter of 06/21/19  Lactic acid, plasma  Result Value Ref Range   Lactic Acid, Venous 1.3 0.5 - 1.9 mmol/L  CBC WITH DIFFERENTIAL  Result Value Ref Range    WBC 7.5 4.0 - 10.5 K/uL   RBC 6.17 (H) 4.22 - 5.81 MIL/uL   Hemoglobin 16.8 13.0 - 17.0 g/dL   HCT 51.4 39.0 - 52.0 %   MCV 83.3 80.0 - 100.0 fL   MCH 27.2 26.0 - 34.0 pg   MCHC 32.7 30.0 - 36.0 g/dL   RDW 13.8 11.5 - 15.5 %   Platelets 160 150 - 400 K/uL   nRBC 0.0 0.0 - 0.2 %   Neutrophils Relative % 68 %   Neutro Abs 5.1 1.7 - 7.7 K/uL   Lymphocytes Relative 18 %   Lymphs Abs 1.4 0.7 - 4.0 K/uL   Monocytes Relative 12 %   Monocytes Absolute 0.9 0.1 - 1.0 K/uL   Eosinophils Relative 0 %   Eosinophils Absolute 0.0 0.0 - 0.5 K/uL   Basophils Relative 1 %   Basophils Absolute 0.0 0.0 - 0.1 K/uL   Immature Granulocytes 1 %   Abs Immature Granulocytes 0.09 (H) 0.00 - 0.07 K/uL  Comprehensive metabolic panel  Result Value Ref Range   Sodium 129 (L) 135 - 145 mmol/L   Potassium 3.6 3.5 - 5.1 mmol/L   Chloride 100 98 - 111 mmol/L   CO2 18 (L) 22 - 32 mmol/L   Glucose, Bld 123 (H) 70 - 99 mg/dL   BUN 21 8 - 23 mg/dL   Creatinine, Ser 0.78 0.61 - 1.24 mg/dL   Calcium 8.3 (L) 8.9 - 10.3 mg/dL   Total Protein 7.6 6.5 - 8.1 g/dL   Albumin 3.7 3.5 - 5.0 g/dL   AST 54 (H) 15 - 41 U/L   ALT 63 (H) 0 - 44 U/L   Alkaline Phosphatase 45 38 - 126 U/L   Total Bilirubin 1.2 0.3 - 1.2 mg/dL   GFR calc non Af Amer >60 >60 mL/min   GFR calc Af Amer >60 >60 mL/min   Anion gap 11 5 - 15  D-dimer, quantitative  Result Value Ref Range   D-Dimer, Quant 1.00 (H) 0.00 - 0.50 ug/mL-FEU  Procalcitonin  Result Value Ref Range   Procalcitonin <0.10 ng/mL  Lactate dehydrogenase  Result Value Ref Range   LDH 242 (H) 98 - 192 U/L  Ferritin  Result Value Ref Range   Ferritin 410 (H) 24 - 336 ng/mL  Triglycerides  Result Value Ref Range   Triglycerides 157 (H) <150 mg/dL  Fibrinogen  Result Value Ref Range   Fibrinogen 608 (H) 210 - 475 mg/dL  C-reactive protein  Result Value Ref Range   CRP 8.4 (H) <1.0 mg/dL  Lipase, blood  Result Value Ref Range   Lipase  29 11 - 51 U/L   Dg Chest  Portable 1 View  Result Date: 06/21/2019 CLINICAL DATA:  Patient presents to the ED with complaint of SOB. Patient is reportedly having orthopnea that is relieved by sitting up. Patient reports he was started on prednisone and antibiotics but self stopped 3 days in. Patient reports he COVID positive COVID+, SOB EXAM: PORTABLE CHEST 1 VIEW COMPARISON:  Chest radiograph 03/03/2015 FINDINGS: Stable cardiac silhouette. There is fine airspace disease in the LEFT lower lobe. RIGHT lung clear. LEFT upper lobe clear. No pneumothorax. No pulmonary edema IMPRESSION: LEFT lower lobe pneumonia. Electronically Signed   By: Genevive BiStewart  Edmunds M.D.   On: 06/21/2019 11:54    EKG EKG Interpretation  Date/Time:  Thursday June 21 2019 11:14:40 EST Ventricular Rate:  111 PR Interval:    QRS Duration: 99 QT Interval:  359 QTC Calculation: 463 R Axis:   -31 Text Interpretation: Sinus tachycardia Multiple premature complexes, vent & supraven No previous tracing Confirmed by Cathren LaineSteinl, Envi Eagleson ([id number]) on 06/21/2019 11:34:43 AM   Radiology Dg Chest Portable 1 View  Result Date: 06/21/2019 CLINICAL DATA:  Patient presents to the ED with complaint of SOB. Patient is reportedly having orthopnea that is relieved by sitting up. Patient reports he was started on prednisone and antibiotics but self stopped 3 days in. Patient reports he COVID positive COVID+, SOB EXAM: PORTABLE CHEST 1 VIEW COMPARISON:  Chest radiograph 03/03/2015 FINDINGS: Stable cardiac silhouette. There is fine airspace disease in the LEFT lower lobe. RIGHT lung clear. LEFT upper lobe clear. No pneumothorax. No pulmonary edema IMPRESSION: LEFT lower lobe pneumonia. Electronically Signed   By: Genevive BiStewart  Edmunds M.D.   On: 06/21/2019 11:54    Procedures Procedures (including critical care time)  Medications Ordered in ED Medications  morphine 4 MG/ML injection 4 mg (has no administration in time range)  ondansetron (ZOFRAN) injection 4 mg (has no  administration in time range)     Initial Impression / Assessment and Plan / ED Course  I have reviewed the triage vital signs and the nursing notes.  Pertinent labs & imaging results that were available during my care of the patient were reviewed by me and considered in my medical decision making (see chart for details).  Iv ns. Labs. Cxr.   Reviewed nursing notes and prior charts for additional history.   Pt requests pain medication. Morphine iv. zofran iv.   Labs reviewed/interpreted by me - lactate normal. Wbc normal.   Xrays reviewed/interpreted by me - ?viral pna. C/w covid.   Recheck pt, denies abd pain. No chest pain. Is breathing comfortably and is not toxic appearing. Pt reports feeling much improved.  Recheck abd soft nt.   Recheck pulse ox 89 % while in bed/sitting up  With minimal activity in room, walking around stretcher, pulse ox is 86-87%, and patient quite dyspneic. Resting rr is also high, with increased wob and covid pna on cxr - will consult hospitalists for admission. 2 liters [place] - sat 93%.   Discussed w hospitalists, Dr Janee MornHompson - will admit, as covid test not showing up in system ([facility] he requests repeat covid testing here - sent.   Emelda FearFouad Al Rifaie was evaluated in Emergency Department on 06/21/2019 for the symptoms described in the history of present illness. He was evaluated in the context of the global COVID-19 pandemic, which necessitated consideration that the patient might be at risk for infection with the SARS-CoV-2 virus that causes COVID-19. Institutional protocols  and algorithms that pertain to the evaluation of patients at risk for COVID-19 are in a state of rapid change based on information released by regulatory bodies including the CDC and federal and state organizations. These policies and algorithms were followed during the patient's care in the ED.   Final Clinical Impressions(s) / ED Diagnoses   Final diagnoses:  None     ED Discharge Orders    None         Cathren Laine, MD 06/21/19 1506    Cathren Laine, MD 06/21/19 1530

## 2019-06-21 NOTE — ED Triage Notes (Signed)
Patient presents to the ED with complaint of SOB. Patient is reportedly having orthopnea that is relieved by sitting up. Patient reports he was started on prednisone and antibiotics but self stopped 3 days in. Patient reports he is COVID positive and on his 10th day on the illness.

## 2019-06-22 ENCOUNTER — Other Ambulatory Visit: Payer: Self-pay

## 2019-06-22 ENCOUNTER — Inpatient Hospital Stay (HOSPITAL_COMMUNITY): Payer: Medicaid Other

## 2019-06-22 ENCOUNTER — Encounter (HOSPITAL_COMMUNITY): Payer: Self-pay

## 2019-06-22 DIAGNOSIS — R9431 Abnormal electrocardiogram [ECG] [EKG]: Secondary | ICD-10-CM

## 2019-06-22 DIAGNOSIS — I4891 Unspecified atrial fibrillation: Secondary | ICD-10-CM | POA: Diagnosis not present

## 2019-06-22 LAB — CBC WITH DIFFERENTIAL/PLATELET
Abs Immature Granulocytes: 0.06 10*3/uL (ref 0.00–0.07)
Basophils Absolute: 0 10*3/uL (ref 0.0–0.1)
Basophils Relative: 0 %
Eosinophils Absolute: 0 10*3/uL (ref 0.0–0.5)
Eosinophils Relative: 0 %
HCT: 45.8 % (ref 39.0–52.0)
Hemoglobin: 14.7 g/dL (ref 13.0–17.0)
Immature Granulocytes: 2 %
Lymphocytes Relative: 41 %
Lymphs Abs: 1.3 10*3/uL (ref 0.7–4.0)
MCH: 27.1 pg (ref 26.0–34.0)
MCHC: 32.1 g/dL (ref 30.0–36.0)
MCV: 84.3 fL (ref 80.0–100.0)
Monocytes Absolute: 0.5 10*3/uL (ref 0.1–1.0)
Monocytes Relative: 15 %
Neutro Abs: 1.4 10*3/uL — ABNORMAL LOW (ref 1.7–7.7)
Neutrophils Relative %: 42 %
Platelets: 158 10*3/uL (ref 150–400)
RBC: 5.43 MIL/uL (ref 4.22–5.81)
RDW: 13.8 % (ref 11.5–15.5)
WBC: 3.2 10*3/uL — ABNORMAL LOW (ref 4.0–10.5)
nRBC: 0 % (ref 0.0–0.2)

## 2019-06-22 LAB — COMPREHENSIVE METABOLIC PANEL
ALT: 47 U/L — ABNORMAL HIGH (ref 0–44)
AST: 38 U/L (ref 15–41)
Albumin: 2.9 g/dL — ABNORMAL LOW (ref 3.5–5.0)
Alkaline Phosphatase: 39 U/L (ref 38–126)
Anion gap: 10 (ref 5–15)
BUN: 19 mg/dL (ref 8–23)
CO2: 20 mmol/L — ABNORMAL LOW (ref 22–32)
Calcium: 7.8 mg/dL — ABNORMAL LOW (ref 8.9–10.3)
Chloride: 107 mmol/L (ref 98–111)
Creatinine, Ser: 0.83 mg/dL (ref 0.61–1.24)
GFR calc Af Amer: >60 mL/min (ref >60)
GFR calc non Af Amer: >60 mL/min (ref >60)
Glucose, Bld: 140 mg/dL — ABNORMAL HIGH (ref 70–99)
Potassium: 4.3 mmol/L (ref 3.5–5.1)
Sodium: 137 mmol/L (ref 135–145)
Total Bilirubin: 0.5 mg/dL (ref 0.3–1.2)
Total Protein: 6.2 g/dL — ABNORMAL LOW (ref 6.5–8.1)

## 2019-06-22 LAB — D-DIMER, QUANTITATIVE: D-Dimer, Quant: 0.69 ug/mL-FEU — ABNORMAL HIGH (ref 0.00–0.50)

## 2019-06-22 LAB — GLUCOSE, CAPILLARY
Glucose-Capillary: 128 mg/dL — ABNORMAL HIGH (ref 70–99)
Glucose-Capillary: 142 mg/dL — ABNORMAL HIGH (ref 70–99)
Glucose-Capillary: 163 mg/dL — ABNORMAL HIGH (ref 70–99)
Glucose-Capillary: 166 mg/dL — ABNORMAL HIGH (ref 70–99)

## 2019-06-22 LAB — ECHOCARDIOGRAM COMPLETE
Height: 65 in
Weight: 2620.83 oz

## 2019-06-22 LAB — TYPE AND SCREEN
ABO/RH(D): B POS
Antibody Screen: NEGATIVE

## 2019-06-22 LAB — FERRITIN: Ferritin: 392 ng/mL — ABNORMAL HIGH (ref 24–336)

## 2019-06-22 LAB — C-REACTIVE PROTEIN: CRP: 7.4 mg/dL — ABNORMAL HIGH (ref <1.0)

## 2019-06-22 LAB — PHOSPHORUS: Phosphorus: 3.5 mg/dL (ref 2.5–4.6)

## 2019-06-22 LAB — TSH: TSH: 0.356 u[IU]/mL (ref 0.350–4.500)

## 2019-06-22 LAB — ABO/RH: ABO/RH(D): B POS

## 2019-06-22 LAB — MAGNESIUM: Magnesium: 2.2 mg/dL (ref 1.7–2.4)

## 2019-06-22 MED ORDER — DIPHENHYDRAMINE HCL 25 MG PO CAPS
25.0000 mg | ORAL_CAPSULE | Freq: Once | ORAL | Status: AC
  Administered 2019-06-23: 25 mg via ORAL
  Filled 2019-06-22: qty 1

## 2019-06-22 MED ORDER — METOPROLOL TARTRATE 5 MG/5ML IV SOLN: 5.0000 mg | INTRAVENOUS | Status: DC | PRN

## 2019-06-22 MED ORDER — METOPROLOL TARTRATE 25 MG PO TABS
25.0000 mg | ORAL_TABLET | Freq: Two times a day (BID) | ORAL | Status: DC
  Administered 2019-06-22: 25 mg via ORAL
  Filled 2019-06-22: qty 1

## 2019-06-22 NOTE — Progress Notes (Signed)
PROGRESS NOTE    Michael Steele  WUJ:[id number] DOB: [birth year] DOA: 06/21/2019 PCP: Inc, [facility] (Inactive)    Brief Narrative:  78 year old gentleman with history of hypertension, hyperlipidemia, GERD who was recently diagnosed with COVID-19 infection on 06/14/2019.  Patient presented to the emergency room with about 4 to 5 days of gradually worsening shortness of breath, fatigue and increased work of breathing as well as nonproductive cough.  No fever. In the emergency room patient noted to be tachycardic with tachypnea, respiratory rate 36.  Saturation 86% on room air.  Chest x-ray with left lower lobe pneumonia.  LFTs normal.  Electrolytes were essentially normal.  Repeat Covid 19 was positive.  Procalcitonin was less than 0.1. 06/22/2019: Early morning hours, he developed A. fib with controlled rate.  Asymptomatic.  Assessment & Plan:   Principal Problem:   Acute hypoxemic respiratory failure due to COVID-19 [facility](HCC) Active Problems:   Acute respiratory disease due to COVID-19 virus   Hyponatremia   Dehydration   HTN (hypertension)   Hyperlipidemia   GERD (gastroesophageal reflux disease)   Pneumonia due to COVID-19 virus   A-fib (HCC)  Acute hypoxic respiratory failure due to COVID-19 pneumonia: Currently on 4 to 6 L of oxygen with ambulation. Procalcitonin less than 0.1. Continue with Solu-Medrol IV remdesivir day 1 Discussed with patient about convalescent plasma, he would like to defer for now and did not consent. Mucinex, Combivent MDI, vitamin C, zinc, supportive care.  Breathing treatments and incentive spirometry. Will need very close monitoring.  Daily inflammatory markers and adjustment of treatment.  New onset A. fib: Developed A. fib overnight.  Rate controlled.  Denies any chest pain or shortness of breath.  TSH normal.  No history of A. fib in the past.  Probably asymptomatic A. fib aggravated by hypoxia and acute infection. Will check  2D echocardiogram. We will start patient on low-dose beta-blockers for rate control. Depends upon ejection fraction, will decide about anticoagulation.  Hypertension: Blood pressures are stable.  On Norvasc that continued.  GERD: On PPI.  Hyperlipidemia: On a statin that is continued.   DVT prophylaxis: Lovenox subcu Code Status: Full code Family Communication: Patient's daughter on the phone. Disposition Plan: Remains in the hospital with active treatment.   Consultants:   None  Procedures:   None  Antimicrobials:   Remdesivir, 06/21/2019---   Subjective: Patient seen and examined.  Patient states that he got slightly short of breath on walking to the bathroom.  Remains afebrile overnight. Before my examination, he had walked back from the bathroom, felt short of breath, oxygen increased to 6 L and saturations maintained more than 91%.  Has some dry cough.  Denies any other symptoms. He advised me to talk to his daughter about his care plan.  Objective: Vitals:   06/22/19 0335 06/22/19 0813 06/22/19 0946 06/22/19 1136  BP: 122/77 120/84  116/67  Pulse: 92 97  93  Resp: (!) 26     Temp: (!) 97.3 F (36.3 C) 97.9 F (36.6 C)  98.1 F (36.7 C)  TempSrc: Oral Oral  Oral  SpO2: 95% 94% 92% 95%  Weight:      Height:        Intake/Output Summary (Last 24 hours) at 06/22/2019 1332 Last data filed at 06/22/2019 1234 Gross per 24 hour  Intake 2892.61 ml  Output 925 ml  Net 1967.61 ml   Filed Weights   06/21/19 2022  Weight: 74.3 kg    Examination:  General exam: Appears calm and comfortable, on 6 L oxygen.  Able to talk in full sentences. Respiratory system: Clear to auscultation. Respiratory effort normal.  Some conducted airway sounds. Cardiovascular system: S1 & S2 heard, irregularly irregular.   No JVD, murmurs, rubs, gallops or clicks. No pedal edema. Gastrointestinal system: Abdomen is nondistended, soft and nontender. No organomegaly or masses felt.  Normal bowel sounds heard. Central nervous system: Alert and oriented. No focal neurological deficits. Extremities: Symmetric 5 x 5 power. Skin: No rashes, lesions or ulcers Psychiatry: Judgement and insight appear normal. Mood & affect appropriate.     Data Reviewed: I have personally reviewed following labs and imaging studies  CBC: Recent Labs  Lab 06/21/19 1146 06/22/19 0618  WBC 7.5 3.2*  NEUTROABS 5.1 1.4*  HGB 16.8 14.7  HCT 51.4 45.8  MCV 83.3 84.3  PLT 160 027   Basic Metabolic Panel: Recent Labs  Lab 06/21/19 1146 06/22/19 0618  NA 129* 137  K 3.6 4.3  CL 100 107  CO2 18* 20*  GLUCOSE 123* 140*  BUN 21 19  CREATININE 0.78 0.83  CALCIUM 8.3* 7.8*  MG  --  2.2  PHOS  --  3.5   GFR: Estimated Creatinine Clearance: 71.3 mL/min (by C-G formula based on SCr of 0.83 mg/dL). Liver Function Tests: Recent Labs  Lab 06/21/19 1146 06/22/19 0618  AST 54* 38  ALT 63* 47*  ALKPHOS 45 39  BILITOT 1.2 0.5  PROT 7.6 6.2*  ALBUMIN 3.7 2.9*   Recent Labs  Lab 06/21/19 1146  LIPASE 29   No results for input(s): AMMONIA in the last 168 hours. Coagulation Profile: No results for input(s): INR, PROTIME in the last 168 hours. Cardiac Enzymes: No results for input(s): CKTOTAL, CKMB, CKMBINDEX, TROPONINI in the last 168 hours. BNP (last 3 results) No results for input(s): PROBNP in the last 8760 hours. HbA1C: No results for input(s): HGBA1C in the last 72 hours. CBG: Recent Labs  Lab 06/21/19 1830 06/21/19 2347 06/22/19 0810 06/22/19 1132  GLUCAP 146* 146* 128* 142*   Lipid Profile: Recent Labs    06/21/19 1146  TRIG 157*   Thyroid Function Tests: Recent Labs    06/22/19 0618  TSH 0.356   Anemia Panel: Recent Labs    06/21/19 1146 06/22/19 0618  FERRITIN 410* 392*   Sepsis Labs: Recent Labs  Lab 06/21/19 1146  PROCALCITON <0.10  LATICACIDVEN 1.3    No results found for this or any previous visit (from the past 240 hour(s)).        Radiology Studies: Dg Chest Portable 1 View  Result Date: 06/21/2019 CLINICAL DATA:  Patient presents to the ED with complaint of SOB. Patient is reportedly having orthopnea that is relieved by sitting up. Patient reports he was started on prednisone and antibiotics but self stopped 3 days in. Patient reports he COVID positive COVID+, SOB EXAM: PORTABLE CHEST 1 VIEW COMPARISON:  Chest radiograph 03/03/2015 FINDINGS: Stable cardiac silhouette. There is fine airspace disease in the LEFT lower lobe. RIGHT lung clear. LEFT upper lobe clear. No pneumothorax. No pulmonary edema IMPRESSION: LEFT lower lobe pneumonia. Electronically Signed   By: Suzy Bouchard M.D.   On: 06/21/2019 11:54        Scheduled Meds: . amLODipine  2.5 mg Oral BID  . cholecalciferol  1,000 Units Oral Daily  . enoxaparin (LOVENOX) injection  40 mg Subcutaneous Q24H  . fenofibrate  160 mg Oral Daily  . guaiFENesin  1,200 mg Oral BID  .  insulin aspart  0-20 Units Subcutaneous TID WC  . Ipratropium-Albuterol  1 puff Inhalation Q6H  . methylPREDNISolone (SOLU-MEDROL) injection  60 mg Intravenous Q12H  . pantoprazole  40 mg Oral Daily  . vitamin C  500 mg Oral Daily  . zinc sulfate  220 mg Oral Daily   Continuous Infusions: . remdesivir 100 mg in NS 250 mL 100 mg (06/22/19 1040)     LOS: 1 day    Time spent: 35 minutes    Dorcas Carrow, MD [facility] Pager [phone]

## 2019-06-22 NOTE — Progress Notes (Signed)
Patient ambulated to the bathroom on RA. When pt returned, increasingly [place], given sch inhaler. Patient currently requiring 6L [place] to maintain sats at 91%. Given incentive spirometer, patient returned demonstration on use. Advised patient not to ambulate without assist until breathing returned to baseline and saturating better. Dr. Sloan Leiter notified regarding this event. Will continue to monitor.

## 2019-06-22 NOTE — Progress Notes (Addendum)
Inpatient Diabetes Program Recommendations  AACE/ADA: New Consensus Statement on Inpatient Glycemic Control (2015)  Target Ranges:  Prepandial:   less than 140 mg/dL      Peak postprandial:   less than 180 mg/dL (1-2 hours)      Critically ill patients:  140 - 180 mg/dL   Results for Michael Steele, Michael Steele (MRN [id number]) as of 06/22/2019 07:46  Ref. Range 06/21/2019 18:30 06/21/2019 23:47  Glucose-Capillary Latest Ref Range: 70 - 99 mg/dL 146 (H)  3 units NOVOLOG  60 mg Solumedrol 146 (H)   Results for Michael Steele, Michael Steele (MRN [id number]) as of 06/22/2019 07:46  Ref. Range 06/22/2019 06:18  Glucose Latest Ref Range: 70 - 99 mg/dL 140 (H)     Admit with: COVID +/ Pneumonia (diagnosed 06/14/2019)  NO History of Diabetes  Current Orders: Novolog Resistant Correction Scale/ SSI (0-20 units) TID AC       Patient got 8 mg Decadron X 1 dose yesterday at 2:47pm.    Now getting Solumedrol 60 mg BID.  CBGs stable so far--Novolog SSi started last PM.      --Will follow patient during hospitalization--  Wyn Quaker RN, MSN, CDE Diabetes Coordinator Inpatient Glycemic Control Team Team Pager: [phone] (8a-5p)

## 2019-06-22 NOTE — Progress Notes (Signed)
  Echocardiogram 2D Echocardiogram has been performed.  Michael Steele 06/22/2019, 3:42 PM

## 2019-06-22 NOTE — Progress Notes (Addendum)
Notified by CCMD that pt's rhythm had changed to A-fib. Appears to be a new issue. (not in Hx)  EKG performed and contacted on- call Dr. Brantley Persons. No new orders.  Pt has been diaphoretic and tachypneic w/ RR of 30. T- 97.7, BP 134/85 and Sat of 98% on 2 :PM. Pt HR shows irregular on monitor at this time 04:27.  New order for Metoprolol 5 Mg PRN Q 4 for HR over 120. (w/parameters)

## 2019-06-23 ENCOUNTER — Inpatient Hospital Stay (HOSPITAL_COMMUNITY): Payer: Medicaid Other

## 2019-06-23 LAB — CBC WITH DIFFERENTIAL/PLATELET
Abs Immature Granulocytes: 0.11 10*3/uL — ABNORMAL HIGH (ref 0.00–0.07)
Basophils Absolute: 0 10*3/uL (ref 0.0–0.1)
Basophils Relative: 0 %
Eosinophils Absolute: 0 10*3/uL (ref 0.0–0.5)
Eosinophils Relative: 0 %
HCT: 43.9 % (ref 39.0–52.0)
Hemoglobin: 14.1 g/dL (ref 13.0–17.0)
Immature Granulocytes: 2 %
Lymphocytes Relative: 21 %
Lymphs Abs: 1.4 10*3/uL (ref 0.7–4.0)
MCH: 26.9 pg (ref 26.0–34.0)
MCHC: 32.1 g/dL (ref 30.0–36.0)
MCV: 83.8 fL (ref 80.0–100.0)
Monocytes Absolute: 0.9 10*3/uL (ref 0.1–1.0)
Monocytes Relative: 14 %
Neutro Abs: 4.2 10*3/uL (ref 1.7–7.7)
Neutrophils Relative %: 63 %
Platelets: 180 10*3/uL (ref 150–400)
RBC: 5.24 MIL/uL (ref 4.22–5.81)
RDW: 13.8 % (ref 11.5–15.5)
WBC: 6.6 10*3/uL (ref 4.0–10.5)
nRBC: 0 % (ref 0.0–0.2)

## 2019-06-23 LAB — COMPREHENSIVE METABOLIC PANEL
ALT: 47 U/L — ABNORMAL HIGH (ref 0–44)
AST: 39 U/L (ref 15–41)
Albumin: 2.9 g/dL — ABNORMAL LOW (ref 3.5–5.0)
Alkaline Phosphatase: 38 U/L (ref 38–126)
Anion gap: 9 (ref 5–15)
BUN: 23 mg/dL (ref 8–23)
CO2: 21 mmol/L — ABNORMAL LOW (ref 22–32)
Calcium: 7.9 mg/dL — ABNORMAL LOW (ref 8.9–10.3)
Chloride: 107 mmol/L (ref 98–111)
Creatinine, Ser: 0.77 mg/dL (ref 0.61–1.24)
GFR calc Af Amer: >60 mL/min (ref >60)
GFR calc non Af Amer: >60 mL/min (ref >60)
Glucose, Bld: 131 mg/dL — ABNORMAL HIGH (ref 70–99)
Potassium: 4 mmol/L (ref 3.5–5.1)
Sodium: 137 mmol/L (ref 135–145)
Total Bilirubin: 0.8 mg/dL (ref 0.3–1.2)
Total Protein: 6.1 g/dL — ABNORMAL LOW (ref 6.5–8.1)

## 2019-06-23 LAB — FERRITIN: Ferritin: 463 ng/mL — ABNORMAL HIGH (ref 24–336)

## 2019-06-23 LAB — PHOSPHORUS: Phosphorus: 2.9 mg/dL (ref 2.5–4.6)

## 2019-06-23 LAB — GLUCOSE, CAPILLARY
Glucose-Capillary: 107 mg/dL — ABNORMAL HIGH (ref 70–99)
Glucose-Capillary: 125 mg/dL — ABNORMAL HIGH (ref 70–99)

## 2019-06-23 LAB — C-REACTIVE PROTEIN: CRP: 3.8 mg/dL — ABNORMAL HIGH (ref <1.0)

## 2019-06-23 LAB — MAGNESIUM: Magnesium: 2.3 mg/dL (ref 1.7–2.4)

## 2019-06-23 LAB — D-DIMER, QUANTITATIVE: D-Dimer, Quant: 0.63 ug/mL-FEU — ABNORMAL HIGH (ref 0.00–0.50)

## 2019-06-23 MED ORDER — DIPHENHYDRAMINE HCL 25 MG PO CAPS
25.0000 mg | ORAL_CAPSULE | Freq: Every evening | ORAL | Status: DC | PRN
  Administered 2019-06-24 – 2019-06-25 (×2): 25 mg via ORAL
  Filled 2019-06-23 (×2): qty 1

## 2019-06-23 MED ORDER — SODIUM CHLORIDE 0.9% IV SOLUTION: Freq: Once | INTRAVENOUS | Status: DC

## 2019-06-23 MED ORDER — TOCILIZUMAB 400 MG/20ML IV SOLN
600.0000 mg | Freq: Once | INTRAVENOUS | Status: AC
  Administered 2019-06-23: 600 mg via INTRAVENOUS
  Filled 2019-06-23: qty 20

## 2019-06-23 NOTE — Progress Notes (Signed)
Patient's inflammatory markers are improving, however he is a still requiring more oxygen.  Currently on 6 L of oxygen.  Chest x-ray with worsening opacities on the left lungs.  I discussed with patient about 2 more modalities of treatment as below:  #1. patient will be given a dose of Actemra, discussed with patient that this may or may not help him.  Patient denies any history of hepatitis, liver failure, history of tuberculosis or active TB.  Also explained that rarely people get secondary infection and fungal infection that are easily treatable.  His benefits will outweigh risk of treatment with Actemra.    #2. convalescent plasma: Experimental.  Patient is about 10 days with initial infection.  Now with worsening hypoxemia.  Discussed with patient that this is one of the experimental modality of treatment giving plasma from patients who have recovered from infection.  This is one of the blood product.  He may benefit with it and may improve survival and recovery.  Side effects include reactions from plasma proteins and very rare infections that are treatable.  Patient is agreeable for convalescent plasma transfusion.

## 2019-06-23 NOTE — Progress Notes (Signed)
Patient transported to [facility] via Care link; Patient was alert and oriented with no signs and symptoms of distress at time of transfer.

## 2019-06-23 NOTE — Plan of Care (Signed)
Patient and family updated on plan of care, all questions answered

## 2019-06-23 NOTE — Progress Notes (Signed)
PROGRESS NOTE    Leonidas Boateng Rifaie  VOJ:[id number] DOB: 1941-06-26 DOA: 06/21/2019 PCP: [facility], [facility] (Inactive)    Brief Narrative:  78 year old gentleman with history of hypertension, hyperlipidemia, GERD who was recently diagnosed with COVID-19 infection on 06/14/2019.  Patient presented to the emergency room with about 4 to 5 days of gradually worsening shortness of breath, fatigue and increased work of breathing as well as nonproductive cough.  No fever. In the emergency room patient noted to be tachycardic with tachypnea, respiratory rate 36.  Saturation 86% on room air.  Chest x-ray with left lower lobe pneumonia.  LFTs normal.  Electrolytes were essentially normal.  Repeat Covid 19 was positive.  Procalcitonin was less than 0.1. 06/22/2019: Early morning hours, he developed transient Afib, converted to sinus rhythm.   Assessment & Plan:   Principal Problem:   Acute hypoxemic respiratory failure due to COVID-19 [facility]) Active Problems:   Acute respiratory disease due to COVID-19 virus   Hyponatremia   Dehydration   HTN (hypertension)   Hyperlipidemia   GERD (gastroesophageal reflux disease)   Pneumonia due to COVID-19 virus   A-fib (HCC)  Acute hypoxic respiratory failure due to COVID-19 pneumonia: Currently on 4 to 6 L of oxygen. Procalcitonin less than 0.1. Continue with Solu-Medrol IV remdesivir day 2 He would not want convalescent plasma.   Mucinex, Combivent MDI, vitamin C, zinc, supportive care.  Breathing treatments and incentive spirometry. Will need very close monitoring.  Daily inflammatory markers and adjustment of treatment.  Repeat chest x-ray today to monitor for progress.  New onset A. fib: transient.  Converted to sinus rhythm.  Started on beta-blockers, has episodes of bradycardia with heart rate less than 50.  Will discontinue.   TSH normal.   2D echocardiogram is normal with normal ejection fraction.   Patient is on aspirin at  home, will resume on discharge.   Depends upon ejection fraction, will decide about anticoagulation.  Hypertension: Blood pressures are stable.  On Norvasc that continued.  GERD: On PPI.  Hyperlipidemia: On a statin that is continued.   DVT prophylaxis: Lovenox subcu Code Status: Full code Family Communication: Patient's daughter on the phone. Patient requested to communicate with daughter so she can communicate with others. Disposition Plan: Remains in the hospital with active treatment. Transfer to [facility] to cohort covid positive patients.    Consultants:   None  Procedures:   None  Antimicrobials:   Remdesivir, 06/21/2019---   Subjective: Patient seen and examined.  No overnight events.  Nursing did not report any hypoxia, however patient was very anxious that his oxygen saturation dropped to 70% when he was sitting at the edge of the bed last night. Some dry cough.  Afebrile. Currently saturating adequate, 95% on 5 L. Telemetry shows sinus rhythm,  Objective: Vitals:   06/22/19 0946 06/22/19 1136 06/22/19 2106 06/23/19 0649  BP:  116/67 136/77 (!) 103/59  Pulse:  93 85 71  Resp:   18 18  Temp:  98.1 F (36.7 C) 97.7 F (36.5 C) 98.3 F (36.8 C)  TempSrc:  Oral Oral Oral  SpO2: 92% 95% 96% 95%  Weight:      Height:        Intake/Output Summary (Last 24 hours) at 06/23/2019 1105 Last data filed at 06/23/2019 0757 Gross per 24 hour  Intake 370 ml  Output 2350 ml  Net -1980 ml   Filed Weights   06/21/19 2022  Weight: 74.3 kg    Examination:  General exam: Appears calm and comfortable, on 5-6 L oxygen.  Able to talk in full sentences. Respiratory system: Clear to auscultation. Respiratory effort normal.  Some conducted airway sounds.   Cardiovascular system: S1 & S2 heard, irregularly irregular.   No JVD, murmurs, rubs, gallops or clicks. No pedal edema. Gastrointestinal system: Abdomen is nondistended, soft and nontender. No organomegaly or masses  felt. Normal bowel sounds heard. Central nervous system: Alert and oriented. No focal neurological deficits. Extremities: Symmetric 5 x 5 power. Skin: No rashes, lesions or ulcers Psychiatry: Judgement and insight appear normal. Mood & affect anxious.    Data Reviewed: I have personally reviewed following labs and imaging studies  CBC: Recent Labs  Lab 06/21/19 1146 06/22/19 0618 06/23/19 0426  WBC 7.5 3.2* 6.6  NEUTROABS 5.1 1.4* 4.2  HGB 16.8 14.7 14.1  HCT 51.4 45.8 43.9  MCV 83.3 84.3 83.8  PLT 160 158 854   Basic Metabolic Panel: Recent Labs  Lab 06/21/19 1146 06/22/19 0618 06/23/19 0426  NA 129* 137 137  K 3.6 4.3 4.0  CL 100 107 107  CO2 18* 20* 21*  GLUCOSE 123* 140* 131*  BUN 21 19 23   CREATININE 0.78 0.83 0.77  CALCIUM 8.3* 7.8* 7.9*  MG  --  2.2 2.3  PHOS  --  3.5 2.9   GFR: Estimated Creatinine Clearance: 74 mL/min (by C-G formula based on SCr of 0.77 mg/dL). Liver Function Tests: Recent Labs  Lab 06/21/19 1146 06/22/19 0618 06/23/19 0426  AST 54* 38 39  ALT 63* 47* 47*  ALKPHOS 45 39 38  BILITOT 1.2 0.5 0.8  PROT 7.6 6.2* 6.1*  ALBUMIN 3.7 2.9* 2.9*   Recent Labs  Lab 06/21/19 1146  LIPASE 29   No results for input(s): AMMONIA in the last 168 hours. Coagulation Profile: No results for input(s): INR, PROTIME in the last 168 hours. Cardiac Enzymes: No results for input(s): CKTOTAL, CKMB, CKMBINDEX, TROPONINI in the last 168 hours. BNP (last 3 results) No results for input(s): PROBNP in the last 8760 hours. HbA1C: No results for input(s): HGBA1C in the last 72 hours. CBG: Recent Labs  Lab 06/22/19 0810 06/22/19 1132 06/22/19 1631 06/22/19 2327 06/23/19 0825  GLUCAP 128* 142* 163* 166* 107*   Lipid Profile: Recent Labs    06/21/19 1146  TRIG 157*   Thyroid Function Tests: Recent Labs    06/22/19 0618  TSH 0.356   Anemia Panel: Recent Labs    06/22/19 0618 06/23/19 0426  FERRITIN 392* 463*   Sepsis Labs: Recent  Labs  Lab 06/21/19 1146  PROCALCITON <0.10  LATICACIDVEN 1.3    No results found for this or any previous visit (from the past 240 hour(s)).       Radiology Studies: Dg Chest Portable 1 View  Result Date: 06/21/2019 CLINICAL DATA:  Patient presents to the ED with complaint of SOB. Patient is reportedly having orthopnea that is relieved by sitting up. Patient reports he was started on prednisone and antibiotics but self stopped 3 days in. Patient reports he COVID positive COVID+, SOB EXAM: PORTABLE CHEST 1 VIEW COMPARISON:  Chest radiograph 03/03/2015 FINDINGS: Stable cardiac silhouette. There is fine airspace disease in the LEFT lower lobe. RIGHT lung clear. LEFT upper lobe clear. No pneumothorax. No pulmonary edema IMPRESSION: LEFT lower lobe pneumonia. Electronically Signed   By: Suzy Bouchard M.D.   On: 06/21/2019 11:54        Scheduled Meds: . amLODipine  2.5 mg Oral BID  .  cholecalciferol  1,000 Units Oral Daily  . enoxaparin (LOVENOX) injection  40 mg Subcutaneous Q24H  . fenofibrate  160 mg Oral Daily  . guaiFENesin  1,200 mg Oral BID  . Ipratropium-Albuterol  1 puff Inhalation Q6H  . methylPREDNISolone (SOLU-MEDROL) injection  60 mg Intravenous Q12H  . pantoprazole  40 mg Oral Daily  . vitamin C  500 mg Oral Daily  . zinc sulfate  220 mg Oral Daily   Continuous Infusions: . remdesivir 100 mg in NS 250 mL 100 mg (06/23/19 0936)     LOS: 2 days    Time spent: 30 minutes    Dorcas CarrowKuber Julion Gatt, MD [facility] Pager [phone]

## 2019-06-23 NOTE — Progress Notes (Signed)
Report given to [place]; advised that patient transporting via care link

## 2019-06-24 DIAGNOSIS — I480 Paroxysmal atrial fibrillation: Secondary | ICD-10-CM

## 2019-06-24 LAB — HEMOGLOBIN A1C
Hgb A1c MFr Bld: 6.6 % — ABNORMAL HIGH (ref 4.8–5.6)
Mean Plasma Glucose: 142.72 mg/dL

## 2019-06-24 LAB — FERRITIN: Ferritin: 649 ng/mL — ABNORMAL HIGH (ref 24–336)

## 2019-06-24 LAB — CBC WITH DIFFERENTIAL/PLATELET
Abs Immature Granulocytes: 0.09 10*3/uL — ABNORMAL HIGH (ref 0.00–0.07)
Basophils Absolute: 0 10*3/uL (ref 0.0–0.1)
Basophils Relative: 0 %
Eosinophils Absolute: 0 10*3/uL (ref 0.0–0.5)
Eosinophils Relative: 0 %
HCT: 44.4 % (ref 39.0–52.0)
Hemoglobin: 14.2 g/dL (ref 13.0–17.0)
Immature Granulocytes: 2 %
Lymphocytes Relative: 18 %
Lymphs Abs: 1.1 10*3/uL (ref 0.7–4.0)
MCH: 26.4 pg (ref 26.0–34.0)
MCHC: 32 g/dL (ref 30.0–36.0)
MCV: 82.7 fL (ref 80.0–100.0)
Monocytes Absolute: 0.5 10*3/uL (ref 0.1–1.0)
Monocytes Relative: 9 %
Neutro Abs: 4.3 10*3/uL (ref 1.7–7.7)
Neutrophils Relative %: 71 %
Platelets: 210 10*3/uL (ref 150–400)
RBC: 5.37 MIL/uL (ref 4.22–5.81)
RDW: 13.8 % (ref 11.5–15.5)
WBC: 6 10*3/uL (ref 4.0–10.5)
nRBC: 0 % (ref 0.0–0.2)

## 2019-06-24 LAB — COMPREHENSIVE METABOLIC PANEL
ALT: 73 U/L — ABNORMAL HIGH (ref 0–44)
AST: 65 U/L — ABNORMAL HIGH (ref 15–41)
Albumin: 3.2 g/dL — ABNORMAL LOW (ref 3.5–5.0)
Alkaline Phosphatase: 47 U/L (ref 38–126)
Anion gap: 11 (ref 5–15)
BUN: 22 mg/dL (ref 8–23)
CO2: 25 mmol/L (ref 22–32)
Calcium: 8.5 mg/dL — ABNORMAL LOW (ref 8.9–10.3)
Chloride: 101 mmol/L (ref 98–111)
Creatinine, Ser: 0.87 mg/dL (ref 0.61–1.24)
GFR calc Af Amer: >60 mL/min (ref >60)
GFR calc non Af Amer: >60 mL/min (ref >60)
Glucose, Bld: 139 mg/dL — ABNORMAL HIGH (ref 70–99)
Potassium: 3.7 mmol/L (ref 3.5–5.1)
Sodium: 137 mmol/L (ref 135–145)
Total Bilirubin: 0.5 mg/dL (ref 0.3–1.2)
Total Protein: 6.5 g/dL (ref 6.5–8.1)

## 2019-06-24 LAB — D-DIMER, QUANTITATIVE: D-Dimer, Quant: 0.33 ug/mL-FEU (ref 0.00–0.50)

## 2019-06-24 LAB — GLUCOSE, CAPILLARY
Glucose-Capillary: 223 mg/dL — ABNORMAL HIGH (ref 70–99)
Glucose-Capillary: 149 mg/dL — ABNORMAL HIGH (ref 70–99)
Glucose-Capillary: 183 mg/dL — ABNORMAL HIGH (ref 70–99)

## 2019-06-24 LAB — MAGNESIUM: Magnesium: 2.3 mg/dL (ref 1.7–2.4)

## 2019-06-24 LAB — C-REACTIVE PROTEIN: CRP: 2.9 mg/dL — ABNORMAL HIGH (ref <1.0)

## 2019-06-24 MED ORDER — INSULIN ASPART 100 UNIT/ML ~~LOC~~ SOLN
0.0000 [IU] | Freq: Three times a day (TID) | SUBCUTANEOUS | Status: DC
  Administered 2019-06-24: 1 [IU] via SUBCUTANEOUS
  Administered 2019-06-24: 3 [IU] via SUBCUTANEOUS
  Administered 2019-06-25: 2 [IU] via SUBCUTANEOUS
  Administered 2019-06-25: 1 [IU] via SUBCUTANEOUS
  Administered 2019-06-26: 2 [IU] via SUBCUTANEOUS

## 2019-06-24 NOTE — Progress Notes (Signed)
Removed O2 from patient, SpO2 89-90% on RA. Ambulated on monitor in hallways, SpO2 sustained at 88% on RA and pt reports feeling SOB. 2LNC started and Spo2 increased to 90-91% during ambulation. Kept on 2LNC at rest at this time w/Spo2 91%. Will continue to monitor and wean O2 as patient tolerates.

## 2019-06-24 NOTE — Progress Notes (Addendum)
PROGRESS NOTE                                                                                                                                                                                                             Patient Demographics:    Michael Steele, is a 78 y.o. male, DOB - 1941-05-11, GNF:[id number]  Outpatient Primary MD for the patient is [facility] (Inactive)   Admit date - 06/21/2019   LOS - 3  Chief Complaint  Patient presents with  . Shortness of Breath  . COVID +       Brief Narrative: Patient is a 78 y.o. male with PMHx of HTN, HLD, GERD-diagnosed with COVID-19 on 11/19-presented with shortness of breath, fever-found to have acute hypoxic respiratory failure in the setting of COVID-19 pneumonia.   Subjective:    Michael Steele today feels much better-he has been titrated down to 1 L of oxygen.   Assessment  & Plan :   Acute Hypoxic Resp Failure due to Covid 19 Viral pneumonia: Markedly improved-Down to 1 L of oxygen.  Continue steroids and remdesivir.  Patient is s/p Actemra and convalescent plasma on 11/28.  Fever: afebrile  O2 requirements:  SpO2: 94 % O2 Flow Rate (L/min): 1 L/min FiO2 (%): 40 %   COVID-19 Labs: Recent Labs    06/21/19 1146 06/22/19 0618 06/23/19 0426 06/24/19 0225  DDIMER 1.00* 0.69* 0.63* 0.33  FERRITIN 410* 392* 463* 649*  LDH 242*  --   --   --   CRP 8.4* 7.4* 3.8* 2.9*    Lab Results  Component Value Date   SARSCOV2NAA Not Detected 04/25/2019     COVID-19 Medications: Steroids: 11/26>> Remdesivir: 11/26>> Actemra: 11/28 x1 Convalescent Plasma: 11/28 x1  Other medications: Diuretics:Euvolemic-no need for lasix Antibiotics:Not needed as no evidence of bacterial infection We will start CBGs monitoring while on steroids-check A1c  Prone/Incentive Spirometry: encouraged incentive spirometry use 3-4/hour.  DVT  Prophylaxis  :  Lovenox  PAF: Back in sinus rhythm-echo with preserved EF. CHADS2VASC score of at least 2-spoke with patient-and then his daughter who is a Psychologist, occupationalmedical student at [employer]-since A. fib was transient in the setting of COVID-19 pneumonia-family/patient wants to hold off on starting anticoagulation.  At this point they would prefer aspirin-and outpatient Holter monitoring before committing patient  to long-term anticoagulation.  HTN: Controlled-continue amlodipine  GERD: GERD  HLD: Continue fenofibrate  Consults  :  None  Procedures  :  None  ABG:    Component Value Date/Time   TCO2 23 02/17/2015 1409    Vent Settings: N/A  Condition -Stable  Family Communication  :Daughter updated over the phone  Code Status :  Full Code  Diet :  Diet Order            Diet regular Room service appropriate? Yes; Fluid consistency: Thin  Diet effective now               Disposition Plan  :  Remain hospitalized  Barriers to discharge: Hypoxia requiring O2 supplementation/complete 5 days of IV Remdesivir  Antimicorbials  :    Anti-infectives (From admission, onward)   Start     Dose/Rate Route Frequency Ordered Stop   06/22/19 1000  remdesivir 100 mg in sodium chloride 0.9 % 250 mL IVPB     100 mg 500 mL/hr over 30 Minutes Intravenous Every 24 hours 06/21/19 1614 06/26/19 0959   06/21/19 1800  remdesivir 200 mg in sodium chloride 0.9 % 250 mL IVPB     200 mg 500 mL/hr over 30 Minutes Intravenous Once 06/21/19 1614 06/21/19 1753      Inpatient Medications  Scheduled Meds: . sodium chloride   Intravenous Once  . amLODipine  2.5 mg Oral BID  . cholecalciferol  1,000 Units Oral Daily  . enoxaparin (LOVENOX) injection  40 mg Subcutaneous Q24H  . fenofibrate  160 mg Oral Daily  . guaiFENesin  1,200 mg Oral BID  . insulin aspart  0-9 Units Subcutaneous TID WC  . Ipratropium-Albuterol  1 puff Inhalation Q6H  . methylPREDNISolone (SOLU-MEDROL) injection  60 mg Intravenous Q12H   . pantoprazole  40 mg Oral Daily  . vitamin C  500 mg Oral Daily  . zinc sulfate  220 mg Oral Daily   Continuous Infusions: . remdesivir 100 mg in NS 250 mL 100 mg (06/24/19 0821)   PRN Meds:.acetaminophen, chlorpheniramine-HYDROcodone, diphenhydrAMINE, guaiFENesin-dextromethorphan, loratadine, metoprolol tartrate, ondansetron **OR** ondansetron (ZOFRAN) IV, senna-docusate, sorbitol, traMADol   Time Spent in minutes  25  See all Orders from today for further details   Oren Binet M.D on 06/24/2019 at 11:06 AM  To page go to [email or web address] - use universal password  [facility] -  Office  [phone]    Objective:   Vitals:   06/23/19 1942 06/24/19 0330 06/24/19 0708 06/24/19 0824  BP: 123/68 134/82 120/87 116/73  Pulse: 81 69 82   Resp: (!) 24 (!) 22 20   Temp: 97.8 F (36.6 C) 98.1 F (36.7 C) 98 F (36.7 C)   TempSrc: Oral Oral Oral   SpO2: 93% 94% 94%   Weight:      Height:        Wt Readings from Last 3 Encounters:  06/21/19 74.3 kg  02/17/15 72.6 kg     Intake/Output Summary (Last 24 hours) at 06/24/2019 1106 Last data filed at 06/24/2019 0800 Gross per 24 hour  Intake 440 ml  Output 1200 ml  Net -760 ml     Physical Exam Gen Exam:Alert awake-not in any distress HEENT:atraumatic, normocephalic Chest: B/L clear to auscultation anteriorly CVS:S1S2 regular Abdomen:soft non tender, non distended Extremities:no edema Neurology: Non focal Skin: no rash   Data Review:    CBC Recent Labs  Lab 06/21/19 1146 06/22/19 0618 06/23/19 0426 06/24/19 0225  WBC 7.5 3.2* 6.6  6.0  HGB 16.8 14.7 14.1 14.2  HCT 51.4 45.8 43.9 44.4  PLT 160 158 180 210  MCV 83.3 84.3 83.8 82.7  MCH 27.2 27.1 26.9 26.4  MCHC 32.7 32.1 32.1 32.0  RDW 13.8 13.8 13.8 13.8  LYMPHSABS 1.4 1.3 1.4 1.1  MONOABS 0.9 0.5 0.9 0.5  EOSABS 0.0 0.0 0.0 0.0  BASOSABS 0.0 0.0 0.0 0.0    Chemistries  Recent Labs  Lab 06/21/19 1146 06/22/19 0618 06/23/19 0426  06/24/19 0225  NA 129* 137 137 137  K 3.6 4.3 4.0 3.7  CL 100 107 107 101  CO2 18* 20* 21* 25  GLUCOSE 123* 140* 131* 139*  BUN 21 19 23 22   CREATININE 0.78 0.83 0.77 0.87  CALCIUM 8.3* 7.8* 7.9* 8.5*  MG  --  2.2 2.3 2.3  AST 54* 38 39 65*  ALT 63* 47* 47* 73*  ALKPHOS 45 39 38 47  BILITOT 1.2 0.5 0.8 0.5   ------------------------------------------------------------------------------------------------------------------ Recent Labs    06/21/19 1146  TRIG 157*    No results found for: HGBA1C ------------------------------------------------------------------------------------------------------------------ Recent Labs    06/22/19 0618  TSH 0.356   ------------------------------------------------------------------------------------------------------------------ Recent Labs    06/23/19 0426 06/24/19 0225  FERRITIN 463* 649*    Coagulation profile No results for input(s): INR, PROTIME in the last 168 hours.  Recent Labs    06/23/19 0426 06/24/19 0225  DDIMER 0.63* 0.33    Cardiac Enzymes No results for input(s): CKMB, TROPONINI, MYOGLOBIN in the last 168 hours.  Invalid input(s): CK ------------------------------------------------------------------------------------------------------------------ No results found for: BNP  Micro Results No results found for this or any previous visit (from the past 240 hour(s)).  Radiology Reports Dg Chest Port 1 View  Result Date: 06/23/2019 CLINICAL DATA:  Shortness of breath. COVID-19 positive. EXAM: PORTABLE CHEST 1 VIEW COMPARISON:  Chest x-ray dated 06/21/2019. FINDINGS: Increasing extent of pneumonia within the LEFT lung, now along the periphery of the LEFT upper lobe. Probable newly visible pneumonia within the periphery of the RIGHT upper lobe, but not as convincing as the LEFT. No pleural effusion. No pneumothorax. Heart size and mediastinal contours are within normal limits. Osseous structures about the chest are  unremarkable. IMPRESSION: 1. Increasing extent of pneumonia within the LEFT lung, now seen along the periphery of the LEFT upper lobe, again most confluent within the LEFT lower lobe. 2. Probable newly visible pneumonia within the periphery of the RIGHT upper lobe, but not as convincing as the LEFT. Electronically Signed   By: 06/23/2019 M.D.   On: 06/23/2019 12:42   Dg Chest Portable 1 View  Result Date: 06/21/2019 CLINICAL DATA:  Patient presents to the ED with complaint of SOB. Patient is reportedly having orthopnea that is relieved by sitting up. Patient reports he was started on prednisone and antibiotics but self stopped 3 days in. Patient reports he COVID positive COVID+, SOB EXAM: PORTABLE CHEST 1 VIEW COMPARISON:  Chest radiograph 03/03/2015 FINDINGS: Stable cardiac silhouette. There is fine airspace disease in the LEFT lower lobe. RIGHT lung clear. LEFT upper lobe clear. No pneumothorax. No pulmonary edema IMPRESSION: LEFT lower lobe pneumonia. Electronically Signed   By: 05/03/2015 M.D.   On: 06/21/2019 11:54

## 2019-06-24 NOTE — Progress Notes (Signed)
Called and spoke with patient's wife Nahil to provide update on patient's status. All questions answered at this time.

## 2019-06-24 NOTE — Plan of Care (Signed)
Teaching continued with patient this morning. All questions answered at this time. Continue to reinforce as needed. VSS. Requiring 1-2LNC for SpO2. Discharge planning ongoing at this time, maybe plan for d/c tomorrow. Continuing remdesivir as ordered. Encouraging OOB/ambulation, patient tolerating well. Encouraging PO intake. Vdg in urinal/BR without difficulty. No c/o pain noted at this time. Safe environment of care maintained. Skin intact, repositions independently.

## 2019-06-25 ENCOUNTER — Other Ambulatory Visit: Payer: Self-pay | Admitting: Cardiology

## 2019-06-25 DIAGNOSIS — R002 Palpitations: Secondary | ICD-10-CM

## 2019-06-25 LAB — PREPARE FRESH FROZEN PLASMA

## 2019-06-25 LAB — COMPREHENSIVE METABOLIC PANEL
ALT: 97 U/L — ABNORMAL HIGH (ref 0–44)
AST: 82 U/L — ABNORMAL HIGH (ref 15–41)
Albumin: 3.4 g/dL — ABNORMAL LOW (ref 3.5–5.0)
Alkaline Phosphatase: 43 U/L (ref 38–126)
Anion gap: 12 (ref 5–15)
BUN: 25 mg/dL — ABNORMAL HIGH (ref 8–23)
CO2: 26 mmol/L (ref 22–32)
Calcium: 8.5 mg/dL — ABNORMAL LOW (ref 8.9–10.3)
Chloride: 101 mmol/L (ref 98–111)
Creatinine, Ser: 0.92 mg/dL (ref 0.61–1.24)
GFR calc Af Amer: >60 mL/min (ref >60)
GFR calc non Af Amer: >60 mL/min (ref >60)
Glucose, Bld: 120 mg/dL — ABNORMAL HIGH (ref 70–99)
Potassium: 4.1 mmol/L (ref 3.5–5.1)
Sodium: 139 mmol/L (ref 135–145)
Total Bilirubin: 0.9 mg/dL (ref 0.3–1.2)
Total Protein: 6.6 g/dL (ref 6.5–8.1)

## 2019-06-25 LAB — CBC WITH DIFFERENTIAL/PLATELET
Abs Immature Granulocytes: 0.18 10*3/uL — ABNORMAL HIGH (ref 0.00–0.07)
Basophils Absolute: 0 10*3/uL (ref 0.0–0.1)
Basophils Relative: 0 %
Eosinophils Absolute: 0 10*3/uL (ref 0.0–0.5)
Eosinophils Relative: 0 %
HCT: 46.2 % (ref 39.0–52.0)
Hemoglobin: 15.1 g/dL (ref 13.0–17.0)
Immature Granulocytes: 3 %
Lymphocytes Relative: 14 %
Lymphs Abs: 1 10*3/uL (ref 0.7–4.0)
MCH: 27.3 pg (ref 26.0–34.0)
MCHC: 32.7 g/dL (ref 30.0–36.0)
MCV: 83.4 fL (ref 80.0–100.0)
Monocytes Absolute: 0.4 10*3/uL (ref 0.1–1.0)
Monocytes Relative: 5 %
Neutro Abs: 5.5 10*3/uL (ref 1.7–7.7)
Neutrophils Relative %: 78 %
Platelets: 219 10*3/uL (ref 150–400)
RBC: 5.54 MIL/uL (ref 4.22–5.81)
RDW: 13.7 % (ref 11.5–15.5)
WBC: 7 10*3/uL (ref 4.0–10.5)
nRBC: 0 % (ref 0.0–0.2)

## 2019-06-25 LAB — BPAM FFP
Blood Product Expiration Date: 202011291606
ISSUE DATE / TIME: 202011281615
Unit Type and Rh: 7300

## 2019-06-25 LAB — D-DIMER, QUANTITATIVE: D-Dimer, Quant: 0.49 ug/mL-FEU (ref 0.00–0.50)

## 2019-06-25 LAB — GLUCOSE, CAPILLARY
Glucose-Capillary: 110 mg/dL — ABNORMAL HIGH (ref 70–99)
Glucose-Capillary: 144 mg/dL — ABNORMAL HIGH (ref 70–99)
Glucose-Capillary: 176 mg/dL — ABNORMAL HIGH (ref 70–99)

## 2019-06-25 LAB — C-REACTIVE PROTEIN: CRP: 1.7 mg/dL — ABNORMAL HIGH (ref <1.0)

## 2019-06-25 LAB — MAGNESIUM: Magnesium: 2.4 mg/dL (ref 1.7–2.4)

## 2019-06-25 LAB — FERRITIN: Ferritin: 647 ng/mL — ABNORMAL HIGH (ref 24–336)

## 2019-06-25 MED ORDER — BENZONATATE 100 MG PO CAPS
200.0000 mg | ORAL_CAPSULE | Freq: Three times a day (TID) | ORAL | Status: DC
  Administered 2019-06-25 – 2019-06-26 (×4): 200 mg via ORAL
  Filled 2019-06-25 (×4): qty 2

## 2019-06-25 MED ORDER — FUROSEMIDE 10 MG/ML IJ SOLN
20.0000 mg | Freq: Once | INTRAMUSCULAR | Status: AC
  Administered 2019-06-25: 20 mg via INTRAVENOUS
  Filled 2019-06-25: qty 2

## 2019-06-25 NOTE — Progress Notes (Signed)
PROGRESS NOTE                                                                                                                                                                                                             Patient Demographics:    Michael Steele, is a 78 y.o. male, DOB - [birth year], ZOX:[id number]  Outpatient Primary MD for the patient is [facility] (Inactive)   Admit date - 06/21/2019   LOS - 4  Chief Complaint  Patient presents with  . Shortness of Breath  . COVID +       Brief Narrative: Patient is a 78 y.o. male with PMHx of HTN, HLD, GERD-diagnosed with COVID-19 on 11/19-presented with shortness of breath, fever-found to have acute hypoxic respiratory failure in the setting of COVID-19 pneumonia.   Subjective:    Michael Steele does not have any major complaints-continues to cough-he was just on 1 L of oxygen earlier this morning.   Assessment  & Plan :   Acute Hypoxic Resp Failure due to Covid 19 Viral pneumonia: Continues to improve-continue steroids and remdesivir.   Patient is s/p Actemra and convalescent plasma on 11/28.  Need to assess for home O2 requirement prior to discharge.  Fever: afebrile  O2 requirements:  SpO2: 90 % O2 Flow Rate (L/min): 1 L/min FiO2 (%): 40 %   COVID-19 Labs: Recent Labs    06/23/19 0426 06/24/19 0225 06/25/19 0142  DDIMER 0.63* 0.33 0.49  FERRITIN 463* 649* 647*  CRP 3.8* 2.9* 1.7*    Lab Results  Component Value Date   SARSCOV2NAA Not Detected 04/25/2019     COVID-19 Medications: Steroids: 11/26>> Remdesivir: 11/26>> Actemra: 11/28 x1 Convalescent Plasma: 11/28 x1  Other medications: Diuretics:Euvolemic-Lasix IV x1-maintain negative balance Antibiotics:Not needed as no evidence of bacterial infection  Prone/Incentive Spirometry: encouraged incentive spirometry use 3-4/hour.  DVT Prophylaxis  :  Lovenox   PAF: Back in sinus rhythm-echo with preserved EF. CHADS2VASC score of at least 2-spoke with patient-and then his daughter who is a Psychologist, occupationalmedical student at [employer]-since A. fib was transient in the setting of COVID-19 pneumonia-family/patient wants to hold off on starting anticoagulation.  At this point they would prefer aspirin-and outpatient Holter monitoring before committing patient to long-term anticoagulation.  Newly diagnosed DM-2 (A1c 6.6 on 11/29): CBG stable-continue SSI-May  need to consider metformin on discharge.  HTN: Controlled-continue amlodipine  GERD: GERD  HLD: Continue fenofibrate  Consults  :  None  Procedures  :  None  ABG:    Component Value Date/Time   TCO2 23 02/17/2015 1409    Vent Settings: N/A  Condition -Stable  Family Communication  :Daughter updated over the phone on 11/30  Code Status :  Full Code  Diet :  Diet Order            Diet regular Room service appropriate? Yes; Fluid consistency: Thin  Diet effective now               Disposition Plan  :  Remain hospitalized-tentative home on 12/1  Barriers to discharge: Hypoxia requiring O2 supplementation/complete 5 days of IV Remdesivir  Antimicorbials  :    Anti-infectives (From admission, onward)   Start     Dose/Rate Route Frequency Ordered Stop   06/22/19 1000  remdesivir 100 mg in sodium chloride 0.9 % 250 mL IVPB     100 mg 500 mL/hr over 30 Minutes Intravenous Every 24 hours 06/21/19 1614 06/25/19 1121   06/21/19 1800  remdesivir 200 mg in sodium chloride 0.9 % 250 mL IVPB     200 mg 500 mL/hr over 30 Minutes Intravenous Once 06/21/19 1614 06/21/19 1753      Inpatient Medications  Scheduled Meds: . amLODipine  2.5 mg Oral BID  . benzonatate  200 mg Oral TID  . cholecalciferol  1,000 Units Oral Daily  . enoxaparin (LOVENOX) injection  40 mg Subcutaneous Q24H  . fenofibrate  160 mg Oral Daily  . guaiFENesin  1,200 mg Oral BID  . insulin aspart  0-9 Units Subcutaneous TID WC  .  Ipratropium-Albuterol  1 puff Inhalation Q6H  . methylPREDNISolone (SOLU-MEDROL) injection  60 mg Intravenous Q12H  . pantoprazole  40 mg Oral Daily  . vitamin C  500 mg Oral Daily  . zinc sulfate  220 mg Oral Daily   Continuous Infusions:  PRN Meds:.acetaminophen, chlorpheniramine-HYDROcodone, diphenhydrAMINE, guaiFENesin-dextromethorphan, loratadine, metoprolol tartrate, ondansetron **OR** ondansetron (ZOFRAN) IV, senna-docusate, sorbitol, traMADol   Time Spent in minutes  25  See all Orders from today for further details   Oren Binet M.D on 06/25/2019 at 1:28 PM  To page go to [email or web address] - use universal password  [facility] -  Office  [phone]    Objective:   Vitals:   06/24/19 1531 06/24/19 1930 06/25/19 0335 06/25/19 0810  BP: 105/72 124/78 123/75 116/78  Pulse: 79 87 69 80  Resp: (!) 24 19 20  (!) 25  Temp: 98.1 F (36.7 C) 98 F (36.7 C) 97.9 F (36.6 C) 98.6 F (37 C)  TempSrc: Oral Oral Oral Oral  SpO2: 90% 92% 93% 90%  Weight:      Height:        Wt Readings from Last 3 Encounters:  06/21/19 74.3 kg  02/17/15 72.6 kg     Intake/Output Summary (Last 24 hours) at 06/25/2019 1328 Last data filed at 06/25/2019 1051 Gross per 24 hour  Intake 673.69 ml  Output 2320 ml  Net -1646.31 ml     Physical Exam Gen Exam:Alert awake-not in any distress HEENT:atraumatic, normocephalic Chest: B/L clear to auscultation anteriorly CVS:S1S2 regular Abdomen:soft non tender, non distended Extremities:no edema Neurology: Non focal Skin: no rash   Data Review:    CBC Recent Labs  Lab 06/21/19 1146 06/22/19 0618 06/23/19 0426 06/24/19 0225 06/25/19 0142  WBC 7.5 3.2* 6.6  6.0 7.0  HGB 16.8 14.7 14.1 14.2 15.1  HCT 51.4 45.8 43.9 44.4 46.2  PLT 160 158 180 210 219  MCV 83.3 84.3 83.8 82.7 83.4  MCH 27.2 27.1 26.9 26.4 27.3  MCHC 32.7 32.1 32.1 32.0 32.7  RDW 13.8 13.8 13.8 13.8 13.7  LYMPHSABS 1.4 1.3 1.4 1.1 1.0  MONOABS 0.9 0.5  0.9 0.5 0.4  EOSABS 0.0 0.0 0.0 0.0 0.0  BASOSABS 0.0 0.0 0.0 0.0 0.0    Chemistries  Recent Labs  Lab 06/21/19 1146 06/22/19 0618 06/23/19 0426 06/24/19 0225 06/25/19 0142  NA 129* 137 137 137 139  K 3.6 4.3 4.0 3.7 4.1  CL 100 107 107 101 101  CO2 18* 20* 21* 25 26  GLUCOSE 123* 140* 131* 139* 120*  BUN 21 19 23 22  25*  CREATININE 0.78 0.83 0.77 0.87 0.92  CALCIUM 8.3* 7.8* 7.9* 8.5* 8.5*  MG  --  2.2 2.3 2.3 2.4  AST 54* 38 39 65* 82*  ALT 63* 47* 47* 73* 97*  ALKPHOS 45 39 38 47 43  BILITOT 1.2 0.5 0.8 0.5 0.9   ------------------------------------------------------------------------------------------------------------------ No results for input(s): CHOL, HDL, LDLCALC, TRIG, CHOLHDL, LDLDIRECT in the last 72 hours.  Lab Results  Component Value Date   HGBA1C 6.6 (H) 06/24/2019   ------------------------------------------------------------------------------------------------------------------ No results for input(s): TSH, T4TOTAL, T3FREE, THYROIDAB in the last 72 hours.  Invalid input(s): FREET3 ------------------------------------------------------------------------------------------------------------------ Recent Labs    06/24/19 0225 06/25/19 0142  FERRITIN 649* 647*    Coagulation profile No results for input(s): INR, PROTIME in the last 168 hours.  Recent Labs    06/24/19 0225 06/25/19 0142  DDIMER 0.33 0.49    Cardiac Enzymes No results for input(s): CKMB, TROPONINI, MYOGLOBIN in the last 168 hours.  Invalid input(s): CK ------------------------------------------------------------------------------------------------------------------ No results found for: BNP  Micro Results No results found for this or any previous visit (from the past 240 hour(s)).  Radiology Reports Dg Chest Port 1 View  Result Date: 06/23/2019 CLINICAL DATA:  Shortness of breath. COVID-19 positive. EXAM: PORTABLE CHEST 1 VIEW COMPARISON:  Chest x-ray dated 06/21/2019.  FINDINGS: Increasing extent of pneumonia within the LEFT lung, now along the periphery of the LEFT upper lobe. Probable newly visible pneumonia within the periphery of the RIGHT upper lobe, but not as convincing as the LEFT. No pleural effusion. No pneumothorax. Heart size and mediastinal contours are within normal limits. Osseous structures about the chest are unremarkable. IMPRESSION: 1. Increasing extent of pneumonia within the LEFT lung, now seen along the periphery of the LEFT upper lobe, again most confluent within the LEFT lower lobe. 2. Probable newly visible pneumonia within the periphery of the RIGHT upper lobe, but not as convincing as the LEFT. Electronically Signed   By: 06/23/2019 M.D.   On: 06/23/2019 12:42   Dg Chest Portable 1 View  Result Date: 06/21/2019 CLINICAL DATA:  Patient presents to the ED with complaint of SOB. Patient is reportedly having orthopnea that is relieved by sitting up. Patient reports he was started on prednisone and antibiotics but self stopped 3 days in. Patient reports he COVID positive COVID+, SOB EXAM: PORTABLE CHEST 1 VIEW COMPARISON:  Chest radiograph 03/03/2015 FINDINGS: Stable cardiac silhouette. There is fine airspace disease in the LEFT lower lobe. RIGHT lung clear. LEFT upper lobe clear. No pneumothorax. No pulmonary edema IMPRESSION: LEFT lower lobe pneumonia. Electronically Signed   By: 05/03/2015 M.D.   On: 06/21/2019 11:54

## 2019-06-25 NOTE — Progress Notes (Signed)
SATURATION QUALIFICATIONS: (This note is used to comply with regulatory documentation for home oxygen)  Patient Saturations on Room Air at Rest = 88 %  Patient Saturations on Room Air while Ambulating = 86 %  Patient Saturations on 2 Liters of oxygen while Ambulating = 92%  Please briefly explain why patient needs home oxygen: Pt unable to maintain O2 saturations while ambulating without 2L oxygen.

## 2019-06-25 NOTE — Progress Notes (Signed)
Spoke with pt primary contact and updated on pt condition and plan of care. Answered all questions and concerns. 

## 2019-06-26 LAB — CBC WITH DIFFERENTIAL/PLATELET
Abs Immature Granulocytes: 0.21 10*3/uL — ABNORMAL HIGH (ref 0.00–0.07)
Basophils Absolute: 0.1 10*3/uL (ref 0.0–0.1)
Basophils Relative: 1 %
Eosinophils Absolute: 0 10*3/uL (ref 0.0–0.5)
Eosinophils Relative: 0 %
HCT: 44.3 % (ref 39.0–52.0)
Hemoglobin: 14.5 g/dL (ref 13.0–17.0)
Immature Granulocytes: 3 %
Lymphocytes Relative: 12 %
Lymphs Abs: 1 10*3/uL (ref 0.7–4.0)
MCH: 27.1 pg (ref 26.0–34.0)
MCHC: 32.7 g/dL (ref 30.0–36.0)
MCV: 82.8 fL (ref 80.0–100.0)
Monocytes Absolute: 0.4 10*3/uL (ref 0.1–1.0)
Monocytes Relative: 4 %
Neutro Abs: 6.7 10*3/uL (ref 1.7–7.7)
Neutrophils Relative %: 80 %
Platelets: 218 10*3/uL (ref 150–400)
RBC: 5.35 MIL/uL (ref 4.22–5.81)
RDW: 13.6 % (ref 11.5–15.5)
WBC: 8.2 10*3/uL (ref 4.0–10.5)
nRBC: 0 % (ref 0.0–0.2)

## 2019-06-26 LAB — COMPREHENSIVE METABOLIC PANEL
ALT: 96 U/L — ABNORMAL HIGH (ref 0–44)
AST: 59 U/L — ABNORMAL HIGH (ref 15–41)
Albumin: 3.1 g/dL — ABNORMAL LOW (ref 3.5–5.0)
Alkaline Phosphatase: 44 U/L (ref 38–126)
Anion gap: 12 (ref 5–15)
BUN: 36 mg/dL — ABNORMAL HIGH (ref 8–23)
CO2: 26 mmol/L (ref 22–32)
Calcium: 8.5 mg/dL — ABNORMAL LOW (ref 8.9–10.3)
Chloride: 99 mmol/L (ref 98–111)
Creatinine, Ser: 1.07 mg/dL (ref 0.61–1.24)
GFR calc Af Amer: >60 mL/min (ref >60)
GFR calc non Af Amer: >60 mL/min (ref >60)
Glucose, Bld: 123 mg/dL — ABNORMAL HIGH (ref 70–99)
Potassium: 4.2 mmol/L (ref 3.5–5.1)
Sodium: 137 mmol/L (ref 135–145)
Total Bilirubin: 0.6 mg/dL (ref 0.3–1.2)
Total Protein: 6.1 g/dL — ABNORMAL LOW (ref 6.5–8.1)

## 2019-06-26 LAB — D-DIMER, QUANTITATIVE: D-Dimer, Quant: 0.6 ug/mL-FEU — ABNORMAL HIGH (ref 0.00–0.50)

## 2019-06-26 LAB — C-REACTIVE PROTEIN: CRP: 1.2 mg/dL — ABNORMAL HIGH (ref <1.0)

## 2019-06-26 LAB — FERRITIN: Ferritin: 514 ng/mL — ABNORMAL HIGH (ref 24–336)

## 2019-06-26 LAB — MAGNESIUM: Magnesium: 2.3 mg/dL (ref 1.7–2.4)

## 2019-06-26 LAB — GLUCOSE, CAPILLARY
Glucose-Capillary: 110 mg/dL — ABNORMAL HIGH (ref 70–99)
Glucose-Capillary: 165 mg/dL — ABNORMAL HIGH (ref 70–99)

## 2019-06-26 MED ORDER — METFORMIN HCL 500 MG PO TABS: 500.0000 mg | ORAL_TABLET | Freq: Two times a day (BID) | ORAL | 0 refills | Status: AC

## 2019-06-26 MED ORDER — PREDNISONE 10 MG PO TABS: ORAL_TABLET | ORAL | 0 refills | Status: DC

## 2019-06-26 MED ORDER — BENZONATATE 200 MG PO CAPS: 200.0000 mg | ORAL_CAPSULE | Freq: Three times a day (TID) | ORAL | 0 refills | Status: DC | PRN

## 2019-06-26 MED ORDER — ASPIRIN EC 81 MG PO TBEC: 81.0000 mg | DELAYED_RELEASE_TABLET | Freq: Every day | ORAL | 2 refills | Status: AC

## 2019-06-26 NOTE — Plan of Care (Signed)
  Problem: Education: Goal: Knowledge of General Education information will improve Description: Including pain rating scale, medication(s)/side effects and non-pharmacologic comfort measures 06/26/2019 1234 by Camillia Herter, RN Outcome: Adequate for Discharge 06/26/2019 0759 by Camillia Herter, RN Outcome: Progressing   Problem: Health Behavior/Discharge Planning: Goal: Ability to manage health-related needs will improve 06/26/2019 1234 by Camillia Herter, RN Outcome: Adequate for Discharge 06/26/2019 0759 by Camillia Herter, RN Outcome: Progressing   Problem: Clinical Measurements: Goal: Ability to maintain clinical measurements within normal limits will improve 06/26/2019 1234 by Camillia Herter, RN Outcome: Adequate for Discharge 06/26/2019 0759 by Camillia Herter, RN Outcome: Progressing Goal: Will remain free from infection 06/26/2019 1234 by Camillia Herter, RN Outcome: Adequate for Discharge 06/26/2019 0759 by Camillia Herter, RN Outcome: Progressing Goal: Diagnostic test results will improve 06/26/2019 1234 by Camillia Herter, RN Outcome: Adequate for Discharge 06/26/2019 0759 by Camillia Herter, RN Outcome: Progressing Goal: Respiratory complications will improve 06/26/2019 1234 by Camillia Herter, RN Outcome: Adequate for Discharge 06/26/2019 0759 by Camillia Herter, RN Outcome: Progressing Goal: Cardiovascular complication will be avoided 06/26/2019 1234 by Camillia Herter, RN Outcome: Adequate for Discharge 06/26/2019 0759 by Camillia Herter, RN Outcome: Progressing   Problem: Activity: Goal: Risk for activity intolerance will decrease 06/26/2019 1234 by Camillia Herter, RN Outcome: Adequate for Discharge 06/26/2019 0759 by Camillia Herter, RN Outcome: Progressing   Problem: Nutrition: Goal: Adequate nutrition will be maintained 06/26/2019 1234 by Camillia Herter, RN Outcome: Adequate for Discharge 06/26/2019 0759 by Camillia Herter, RN Outcome: Progressing   Problem:  Coping: Goal: Level of anxiety will decrease 06/26/2019 1234 by Camillia Herter, RN Outcome: Adequate for Discharge 06/26/2019 0759 by Camillia Herter, RN Outcome: Progressing   Problem: Elimination: Goal: Will not experience complications related to bowel motility 06/26/2019 1234 by Camillia Herter, RN Outcome: Adequate for Discharge 06/26/2019 0759 by Camillia Herter, RN Outcome: Progressing Goal: Will not experience complications related to urinary retention 06/26/2019 1234 by Camillia Herter, RN Outcome: Adequate for Discharge 06/26/2019 0759 by Camillia Herter, RN Outcome: Progressing   Problem: Pain Managment: Goal: General experience of comfort will improve 06/26/2019 1234 by Camillia Herter, RN Outcome: Adequate for Discharge 06/26/2019 0759 by Camillia Herter, RN Outcome: Progressing   Problem: Safety: Goal: Ability to remain free from injury will improve 06/26/2019 1234 by Camillia Herter, RN Outcome: Adequate for Discharge 06/26/2019 0759 by Camillia Herter, RN Outcome: Progressing   Problem: Skin Integrity: Goal: Risk for impaired skin integrity will decrease 06/26/2019 1234 by Camillia Herter, RN Outcome: Adequate for Discharge 06/26/2019 0759 by Camillia Herter, RN Outcome: Progressing

## 2019-06-26 NOTE — Progress Notes (Signed)
Pt discharged to home. Medication list, follow up appts, and prescriptions to pick up at pharmacy given to patient. Pt verbalized understanding of discharge instructions.

## 2019-06-26 NOTE — Evaluation (Signed)
Physical Therapy Evaluation Patient Details Name: Michael Steele MRN: [id number] DOB: Apr 16, 1941 Today's Date: 06/26/2019   History of Present Illness  atient is a 78 y.o. male with PMHx of HTN, HLD, GERD-diagnosed with COVID-19 on 11/19-presented with shortness of breath, fever-found to have acute hypoxic respiratory failure in the setting of COVID-19 pneumonia.  Clinical Impression  The patient is mobilizing well. Ambulated on RA with SPO2 87%, on 1 L >90%. Plans DC today. No further PT needs, will sign off.     Follow Up Recommendations No PT follow up    Equipment Recommendations  None recommended by PT    Recommendations for Other Services       Precautions / Restrictions Precautions Precaution Comments: monitor spo2      Mobility  Bed Mobility               General bed mobility comments: OOB  Transfers Overall transfer level: Independent Equipment used: None                Ambulation/Gait Ambulation/Gait assistance: Independent   Assistive device: None Gait Pattern/deviations: WFL(Within Functional Limits)   Gait velocity interpretation: >2.62 ft/sec, indicative of community ambulatory General Gait Details: 400 on 1 L [place] SPO2 92%  Stairs            Wheelchair Mobility    Modified Rankin (Stroke Patients Only)       Balance Overall balance assessment: No apparent balance deficits (not formally assessed)                                           Pertinent Vitals/Pain      Home Living Family/patient expects to be discharged to:: Private residence Living Arrangements: Spouse/significant other;Children Available Help at Discharge: Family Type of Home: House       Home Layout: One level Home Equipment: None      Prior Function Level of Independence: Independent               Hand Dominance        Extremity/Trunk Assessment   Upper Extremity Assessment Upper Extremity Assessment: Overall WFL for  tasks assessed    Lower Extremity Assessment Lower Extremity Assessment: Overall WFL for tasks assessed       Communication   Communication: Prefers language other than English  Cognition Arousal/Alertness: Awake/alert Behavior During Therapy: WFL for tasks assessed/performed Overall Cognitive Status: Within Functional Limits for tasks assessed                                        General Comments      Exercises Other Exercises Other Exercises: heraband for UE, Flutter vaklve 10 times per hr   Assessment/Plan    PT Assessment Patent does not need any further PT services  PT Problem List         PT Treatment Interventions      PT Goals (Current goals can be found in the Care Plan section)  Acute Rehab PT Goals Patient Stated Goal: go home PT Goal Formulation: All assessment and education complete, DC therapy    Frequency     Barriers to discharge        Co-evaluation               AM-PAC  PT "6 Clicks" Mobility  Outcome Measure Help needed turning from your back to your side while in a flat bed without using bedrails?: None Help needed moving from lying on your back to sitting on the side of a flat bed without using bedrails?: None Help needed moving to and from a bed to a chair (including a wheelchair)?: None Help needed standing up from a chair using your arms (e.g., wheelchair or bedside chair)?: None Help needed to walk in hospital room?: None Help needed climbing 3-5 steps with a railing? : None 6 Click Score: 24    End of Session Equipment Utilized During Treatment: Oxygen Activity Tolerance: Patient tolerated treatment well Patient left: in chair;with call bell/phone within reach Nurse Communication: Mobility status PT Visit Diagnosis: Difficulty in walking, not elsewhere classified (R26.2)    Time: 1478-2956 PT Time Calculation (min) (ACUTE ONLY): 30 min   Charges:   PT Evaluation $PT Eval Low Complexity: 1 Low PT  Treatments $Gait Training: 8-22 mins        Blanchard Kelch PT Acute Rehabilitation Services Pager [phone] Office [phone]   Rada Hay 06/26/2019, 12:34 PM

## 2019-06-26 NOTE — Plan of Care (Signed)
  Problem: Education: Goal: Knowledge of General Education information will improve Description: Including pain rating scale, medication(s)/side effects and non-pharmacologic comfort measures Outcome: Progressing   Problem: Activity: Goal: Risk for activity intolerance will decrease Outcome: Progressing   Problem: Nutrition: Goal: Adequate nutrition will be maintained Outcome: Progressing   Problem: Coping: Goal: Level of anxiety will decrease Outcome: Progressing   Problem: Elimination: Goal: Will not experience complications related to urinary retention Outcome: Progressing   Problem: Safety: Goal: Ability to remain free from injury will improve Outcome: Progressing   Problem: Skin Integrity: Goal: Risk for impaired skin integrity will decrease Outcome: Progressing

## 2019-06-26 NOTE — Discharge Summary (Signed)
PATIENT DETAILS Name: Michael Steele Age: 78 y.o. Sex: male Date of Birth: [birth year] MRN: [id number]. Admitting Physician: Rodolph Bonganiel Thompson V, MD PCP:[facility] (Inactive)  Admit Date: 06/21/2019 Discharge date: 06/26/2019  Recommendations for Outpatient Follow-up:  1. Follow up with PCP in 1-2 weeks 2. Please obtain CMP/CBC in one week 3. Repeat Chest Xray in 4-6 week 4. Please assess need for home O2 at follow up with PCP  Admitted From:  Home  Disposition: Home    Home Health:  Yes  Equipment/Devices:  oxygen 2L  Discharge Condition: Stable  CODE STATUS: FULL CODE  Diet recommendation:  Diet Order            Diet - low sodium heart healthy        Diet Carb Modified        Diet regular Room service appropriate? Yes; Fluid consistency: Thin  Diet effective now               Brief Summary: See H&P, Labs, Consult and Test reports for all details in brief, Patient is a 78 y.o. male with PMHx of HTN, HLD, GERD-diagnosed with COVID-19 on 11/19-presented with shortness of breath, fever-found to have acute hypoxic respiratory failure in the setting of COVID-19 pneumonia.  Brief Hospital Course: Acute Hypoxic Resp Failure due to Covid 19 Viral pneumonia: Much improved, treated with steroids and remdesivir.   Patient is s/p Actemra and convalescent plasma on 11/28.Completed remdesivir on 11/30, will be discharged home on tapering steroids. Appears to desaturate to 88-87% with ambulation, hence will discharge on home O2-however he is not SOB  COVID-19 Labs:  Recent Labs    06/24/19 0225 06/25/19 0142 06/26/19 0225  DDIMER 0.33 0.49 0.60*  FERRITIN 649* 647* 514*  CRP 2.9* 1.7* 1.2*    Lab Results  Component Value Date   SARSCOV2NAA Not Detected 04/25/2019     COVID-19 Medications: Steroids: 11/26>> Remdesivir: 11/26>> Actemra: 11/28 x1 Convalescent Plasma: 11/28 x1  PAF: Back in sinus rhythm-echo with preserved EF.  CHADS2VASC score of at least 2-spoke with patient-and then his daughter who is a Psychologist, occupationalmedical student at [employer]-since A. fib was transient in the setting of COVID-19 pneumonia-family/patient wants to hold off on starting anticoagulation.  At this point they would prefer aspirin-and outpatient Holter monitoring before committing patient to long-term anticoagulation.[facility], for outpatient monitor, they will contact patient.   Newly diagnosed DM-2 (A1c 6.6 on 11/29): CBG stable-continue SSI-will require metformin on discharge.  HTN: Controlled-continue amlodipine  GERD: GERD  HLD: Continue fenofibrate  Procedures/Studies: None  Discharge Diagnoses:  Principal Problem:   Acute hypoxemic respiratory failure due to COVID-19 [facility](HCC) Active Problems:   Acute respiratory disease due to COVID-19 virus   Hyponatremia   Dehydration   HTN (hypertension)   Hyperlipidemia   GERD (gastroesophageal reflux disease)   Pneumonia due to COVID-19 virus   A-fib [facility](HCC)   Discharge Instructions:    Person Under Monitoring Name: Michael Steele  Location: [address] [place] [id number]   Infection Prevention Recommendations for Individuals Confirmed to have, or Being Evaluated for, 2019 Novel Coronavirus (COVID-19) Infection Who Receive Care at Home  Individuals who are confirmed to have, or are being evaluated for, COVID-19 should follow the prevention steps below until a healthcare provider or local or state health department says they can return to normal activities.  Stay home except to get medical care You should restrict activities outside your  home, except for getting medical care. Do not go to work, school, or public areas, and do not use public transportation or taxis.  Call ahead before visiting your doctor Before your medical appointment, call the healthcare provider and tell them that you have, or are being evaluated for, COVID-19 infection.  This will help the healthcare providers office take steps to keep other people from getting infected. Ask your healthcare provider to call the local or state health department.  Monitor your symptoms Seek prompt medical attention if your illness is worsening (e.g., difficulty breathing). Before going to your medical appointment, call the healthcare provider and tell them that you have, or are being evaluated for, COVID-19 infection. Ask your healthcare provider to call the local or state health department.  Wear a facemask You should wear a facemask that covers your nose and mouth when you are in the same room with other people and when you visit a healthcare provider. People who live with or visit you should also wear a facemask while they are in the same room with you.  Separate yourself from other people in your home As much as possible, you should stay in a different room from other people in your home. Also, you should use a separate bathroom, if available.  Avoid sharing household items You should not share dishes, drinking glasses, cups, eating utensils, towels, bedding, or other items with other people in your home. After using these items, you should wash them thoroughly with soap and water.  Cover your coughs and sneezes Cover your mouth and nose with a tissue when you cough or sneeze, or you can cough or sneeze into your sleeve. Throw used tissues in a lined trash can, and immediately wash your hands with soap and water for at least 20 seconds or use an alcohol-based hand rub.  Wash your [employer] your hands often and thoroughly with soap and water for at least 20 seconds. You can use an alcohol-based hand sanitizer if soap and water are not available and if your hands are not visibly dirty. Avoid touching your eyes, nose, and mouth with unwashed hands.   Prevention Steps for Caregivers and Household Members of Individuals Confirmed to have, or Being Evaluated for,  COVID-19 Infection Being Cared for in the Home  If you live with, or provide care at home for, a person confirmed to have, or being evaluated for, COVID-19 infection please follow these guidelines to prevent infection:  Follow healthcare providers instructions Make sure that you understand and can help the patient follow any healthcare provider instructions for all care.  Provide for the patients basic needs You should help the patient with basic needs in the home and provide support for getting groceries, prescriptions, and other personal needs.  Monitor the patients symptoms If they are getting sicker, call his or her medical provider and tell them that the patient has, or is being evaluated for, COVID-19 infection. This will help the healthcare providers office take steps to keep other people from getting infected. Ask the healthcare provider to call the local or state health department.  Limit the number of people who have contact with the patient  If possible, have only one caregiver for the patient.  Other household members should stay in another home or place of residence. If this is not possible, they should stay  in another room, or be separated from the patient as much as possible. Use a separate bathroom, if available.  Restrict visitors who do not  have an essential need to be in the home.  Keep older adults, very young children, and other sick people away from the patient Keep older adults, very young children, and those who have compromised immune systems or chronic health conditions away from the patient. This includes people with chronic heart, lung, or kidney conditions, diabetes, and cancer.  Ensure good ventilation Make sure that shared spaces in the home have good air flow, such as from an air conditioner or an opened window, weather permitting.  Wash your hands often  Wash your hands often and thoroughly with soap and water for at least 20 seconds. You can  use an alcohol based hand sanitizer if soap and water are not available and if your hands are not visibly dirty.  Avoid touching your eyes, nose, and mouth with unwashed hands.  Use disposable paper towels to dry your hands. If not available, use dedicated cloth towels and replace them when they become wet.  Wear a facemask and gloves  Wear a disposable facemask at all times in the room and gloves when you touch or have contact with the patients blood, body fluids, and/or secretions or excretions, such as sweat, saliva, sputum, nasal mucus, vomit, urine, or feces.  Ensure the mask fits over your nose and mouth tightly, and do not touch it during use.  Throw out disposable facemasks and gloves after using them. Do not reuse.  Wash your hands immediately after removing your facemask and gloves.  If your personal clothing becomes contaminated, carefully remove clothing and launder. Wash your hands after handling contaminated clothing.  Place all used disposable facemasks, gloves, and other waste in a lined container before disposing them with other household waste.  Remove gloves and wash your hands immediately after handling these items.  Do not share dishes, glasses, or other household items with the patient  Avoid sharing household items. You should not share dishes, drinking glasses, cups, eating utensils, towels, bedding, or other items with a patient who is confirmed to have, or being evaluated for, COVID-19 infection.  After the person uses these items, you should wash them thoroughly with soap and water.  Wash laundry thoroughly  Immediately remove and wash clothes or bedding that have blood, body fluids, and/or secretions or excretions, such as sweat, saliva, sputum, nasal mucus, vomit, urine, or feces, on them.  Wear gloves when handling laundry from the patient.  Read and follow directions on labels of laundry or clothing items and detergent. In general, wash and dry with the  warmest temperatures recommended on the label.  Clean all areas the individual has used often  Clean all touchable surfaces, such as counters, tabletops, doorknobs, bathroom fixtures, toilets, phones, keyboards, tablets, and bedside tables, every day. Also, clean any surfaces that may have blood, body fluids, and/or secretions or excretions on them.  Wear gloves when cleaning surfaces the patient has come in contact with.  Use a diluted bleach solution (e.g., dilute bleach with 1 part bleach and 10 parts water) or a household disinfectant with a label that says EPA-registered for coronaviruses. To make a bleach solution at home, add 1 tablespoon of bleach to 1 quart (4 cups) of water. For a larger supply, add  cup of bleach to 1 gallon (16 cups) of water.  Read labels of cleaning products and follow recommendations provided on product labels. Labels contain instructions for safe and effective use of the cleaning product including precautions you should take when applying the product, such as wearing gloves  or eye protection and making sure you have good ventilation during use of the product.  Remove gloves and wash hands immediately after cleaning.  Monitor yourself for signs and symptoms of illness Caregivers and household members are considered close contacts, should monitor their health, and will be asked to limit movement outside of the home to the extent possible. Follow the monitoring steps for close contacts listed on the symptom monitoring form.   ? If you have additional questions, contact your local health department or call the epidemiologist on call at [phone] (available 24/7). ? This guidance is subject to change. For the most up-to-date guidance from CDC, please refer to their website: YouBlogs.pl    Activity:  As tolerated   Discharge Instructions    Call MD for:  difficulty breathing, headache or visual  disturbances   Complete by: As directed    Call MD for:  extreme fatigue   Complete by: As directed    Call MD for:  persistant dizziness or light-headedness   Complete by: As directed    Call MD for:  persistant nausea and vomiting   Complete by: As directed    Diet - low sodium heart healthy   Complete by: As directed    Diet Carb Modified   Complete by: As directed    Discharge instructions   Complete by: As directed    Follow with Primary MD  [facility] (Inactive) in 1-2 weeks  Please get a complete blood count and chemistry panel checked by your Primary MD at your next visit, and again as instructed by your Primary MD.  Get Medicines reviewed and adjusted: Please take all your medications with you for your next visit with your Primary MD  Laboratory/radiological data: Please request your Primary MD to go over all hospital tests and procedure/radiological results at the follow up, please ask your Primary MD to get all Hospital records sent to his/her office.  In some cases, they will be blood work, cultures and biopsy results pending at the time of your discharge. Please request that your primary care M.D. follows up on these results.  Also Note the following: If you experience worsening of your admission symptoms, develop shortness of breath, life threatening emergency, suicidal or homicidal thoughts you must seek medical attention immediately by calling 911 or calling your MD immediately  if symptoms less severe.  You must read complete instructions/literature along with all the possible adverse reactions/side effects for all the Medicines you take and that have been prescribed to you. Take any new Medicines after you have completely understood and accpet all the possible adverse reactions/side effects.   Do not drive when taking Pain medications or sleeping medications (Benzodaizepines)  Do not take more than prescribed Pain, Sleep and Anxiety  Medications. It is not advisable to combine anxiety,sleep and pain medications without talking with your primary care practitioner  Special Instructions: If you have smoked or chewed Tobacco  in the last 2 yrs please stop smoking, stop any regular Alcohol  and or any Recreational drug use.  Wear Seat belts while driving.  Please note: You were cared for by a hospitalist during your hospital stay. Once you are discharged, your primary care physician will handle any further medical issues. Please note that NO REFILLS for any discharge medications will be authorized once you are discharged, as it is imperative that you return to your primary care physician (or establish a relationship with a primary care physician if you  do not have one) for your post hospital discharge needs so that they can reassess your need for medications and monitor your lab values.   Isolation for 3 weeks from 06/21/19 (day of covid +ve test)   Increase activity slowly   Complete by: As directed      Allergies as of 06/26/2019   No Known Allergies     Medication List    STOP taking these medications   azithromycin 250 MG tablet Commonly known as: ZITHROMAX     TAKE these medications   amLODipine 2.5 MG tablet Commonly known as: NORVASC Take 2.5 mg by mouth 2 (two) times daily.   aspirin EC 81 MG tablet Take 1 tablet (81 mg total) by mouth daily.   benzonatate 200 MG capsule Commonly known as: TESSALON Take 1 capsule (200 mg total) by mouth 3 (three) times daily as needed for cough.   cetirizine 5 MG tablet Commonly known as: ZYRTEC Take 5 mg by mouth daily as needed for allergies.   fenofibrate 48 MG tablet Commonly known as: TRICOR Take 48 mg by mouth daily.   metFORMIN 500 MG tablet Commonly known as: Glucophage Take 1 tablet (500 mg total) by mouth 2 (two) times daily with a meal.   omeprazole 20 MG capsule Commonly known as: PRILOSEC Take 20 mg by mouth daily.   predniSONE 10 MG  tablet Commonly known as: DELTASONE Take 40 mg daily for 1 day, 30 mg daily for 1 day, 20 mg daily for 1 days,10 mg daily for 1 day, then stop What changed:   medication strength  how much to take  how to take this  when to take this  additional instructions   VITAMIN C PO Take 1 tablet by mouth daily.   VITAMIN D PO Take 1 capsule by mouth daily.            Durable Medical Equipment  (From admission, onward)         Start     Ordered   06/26/19 0728  For home use only DME oxygen  Once    Question Answer Comment  Length of Need 6 Months   Mode or (Route) Nasal cannula   Liters per Minute 2   Frequency Continuous (stationary and portable oxygen unit needed)   Oxygen conserving device Yes   Oxygen delivery system Gas      06/26/19 0728         Follow-up Information    [facility]. Schedule an appointment as soon as possible for a visit in 2 week(s).   Contact information: [address] [place] [phone]          No Known Allergies   Consultations:   None   Other Procedures/Studies: Dg Chest Port 1 View  Result Date: 06/23/2019 CLINICAL DATA:  Shortness of breath. COVID-19 positive. EXAM: PORTABLE CHEST 1 VIEW COMPARISON:  Chest x-ray dated 06/21/2019. FINDINGS: Increasing extent of pneumonia within the LEFT lung, now along the periphery of the LEFT upper lobe. Probable newly visible pneumonia within the periphery of the RIGHT upper lobe, but not as convincing as the LEFT. No pleural effusion. No pneumothorax. Heart size and mediastinal contours are within normal limits. Osseous structures about the chest are unremarkable. IMPRESSION: 1. Increasing extent of pneumonia within the LEFT lung, now seen along the periphery of the LEFT upper lobe, again most confluent within the LEFT lower lobe. 2. Probable newly visible pneumonia within  the periphery of the RIGHT upper lobe, but not as convincing as the  LEFT. Electronically Signed   By: Bary Richard M.D.   On: 06/23/2019 12:42   Dg Chest Portable 1 View  Result Date: 06/21/2019 CLINICAL DATA:  Patient presents to the ED with complaint of SOB. Patient is reportedly having orthopnea that is relieved by sitting up. Patient reports he was started on prednisone and antibiotics but self stopped 3 days in. Patient reports he COVID positive COVID+, SOB EXAM: PORTABLE CHEST 1 VIEW COMPARISON:  Chest radiograph 03/03/2015 FINDINGS: Stable cardiac silhouette. There is fine airspace disease in the LEFT lower lobe. RIGHT lung clear. LEFT upper lobe clear. No pneumothorax. No pulmonary edema IMPRESSION: LEFT lower lobe pneumonia. Electronically Signed   By: Genevive Bi M.D.   On: 06/21/2019 11:54     TODAY-DAY OF DISCHARGE:  Subjective:   Michael Steele today has no headache,no chest abdominal pain,no new weakness tingling or numbness, feels much better wants to go home today.  Objective:   Blood pressure 113/75, pulse 86, temperature 98.6 F (37 C), temperature source Oral, resp. rate 18, height 5\' 5"  (1.651 m), weight 74.3 kg, SpO2 91 %.  Intake/Output Summary (Last 24 hours) at 06/26/2019 1015 Last data filed at 06/26/2019 0300 Gross per 24 hour  Intake 240 ml  Output 2025 ml  Net -1785 ml   Filed Weights   06/21/19 2022  Weight: 74.3 kg    Exam: Awake Alert, Oriented *3, No new F.N deficits, Normal affect [place].AT,PERRAL Supple Neck,No JVD, No cervical lymphadenopathy appriciated.  Symmetrical Chest wall movement, Good air movement bilaterally, CTAB RRR,No Gallops,Rubs or new Murmurs, No Parasternal Heave +ve B.Sounds, Abd Soft, Non tender, No organomegaly appriciated, No rebound -guarding or rigidity. No Cyanosis, Clubbing or edema, No new Rash or bruise   PERTINENT RADIOLOGIC STUDIES: Dg Chest Port 1 View  Result Date: 06/23/2019 CLINICAL DATA:  Shortness of breath. COVID-19 positive. EXAM: PORTABLE CHEST 1 VIEW COMPARISON:   Chest x-ray dated 06/21/2019. FINDINGS: Increasing extent of pneumonia within the LEFT lung, now along the periphery of the LEFT upper lobe. Probable newly visible pneumonia within the periphery of the RIGHT upper lobe, but not as convincing as the LEFT. No pleural effusion. No pneumothorax. Heart size and mediastinal contours are within normal limits. Osseous structures about the chest are unremarkable. IMPRESSION: 1. Increasing extent of pneumonia within the LEFT lung, now seen along the periphery of the LEFT upper lobe, again most confluent within the LEFT lower lobe. 2. Probable newly visible pneumonia within the periphery of the RIGHT upper lobe, but not as convincing as the LEFT. Electronically Signed   By: 06/23/2019 M.D.   On: 06/23/2019 12:42   Dg Chest Portable 1 View  Result Date: 06/21/2019 CLINICAL DATA:  Patient presents to the ED with complaint of SOB. Patient is reportedly having orthopnea that is relieved by sitting up. Patient reports he was started on prednisone and antibiotics but self stopped 3 days in. Patient reports he COVID positive COVID+, SOB EXAM: PORTABLE CHEST 1 VIEW COMPARISON:  Chest radiograph 03/03/2015 FINDINGS: Stable cardiac silhouette. There is fine airspace disease in the LEFT lower lobe. RIGHT lung clear. LEFT upper lobe clear. No pneumothorax. No pulmonary edema IMPRESSION: LEFT lower lobe pneumonia. Electronically Signed   By: 05/03/2015 M.D.   On: 06/21/2019 11:54     PERTINENT LAB RESULTS: CBC: Recent Labs    06/25/19 0142 06/26/19 0225  WBC 7.0 8.2  HGB  15.1 14.5  HCT 46.2 44.3  PLT 219 218   CMET CMP     Component Value Date/Time   NA 137 06/26/2019 0225   K 4.2 06/26/2019 0225   CL 99 06/26/2019 0225   CO2 26 06/26/2019 0225   GLUCOSE 123 (H) 06/26/2019 0225   BUN 36 (H) 06/26/2019 0225   CREATININE 1.07 06/26/2019 0225   CALCIUM 8.5 (L) 06/26/2019 0225   PROT 6.1 (L) 06/26/2019 0225   ALBUMIN 3.1 (L) 06/26/2019 0225   AST 59  (H) 06/26/2019 0225   ALT 96 (H) 06/26/2019 0225   ALKPHOS 44 06/26/2019 0225   BILITOT 0.6 06/26/2019 0225   GFRNONAA >60 06/26/2019 0225   GFRAA >60 06/26/2019 0225    GFR Estimated Creatinine Clearance: 55.3 mL/min (by C-G formula based on SCr of 1.07 mg/dL). No results for input(s): LIPASE, AMYLASE in the last 72 hours. No results for input(s): CKTOTAL, CKMB, CKMBINDEX, TROPONINI in the last 72 hours. Invalid input(s): POCBNP Recent Labs    06/25/19 0142 06/26/19 0225  DDIMER 0.49 0.60*   Recent Labs    06/24/19 0225  HGBA1C 6.6*   No results for input(s): CHOL, HDL, LDLCALC, TRIG, CHOLHDL, LDLDIRECT in the last 72 hours. No results for input(s): TSH, T4TOTAL, T3FREE, THYROIDAB in the last 72 hours.  Invalid input(s): FREET3 Recent Labs    06/25/19 0142 06/26/19 0225  FERRITIN 647* 514*   Coags: No results for input(s): INR in the last 72 hours.  Invalid input(s): PT Microbiology: No results found for this or any previous visit (from the past 240 hour(s)).  FURTHER DISCHARGE INSTRUCTIONS:  Get Medicines reviewed and adjusted: Please take all your medications with you for your next visit with your Primary MD  Laboratory/radiological data: Please request your Primary MD to go over all hospital tests and procedure/radiological results at the follow up, please ask your Primary MD to get all Hospital records sent to his/her office.  In some cases, they will be blood work, cultures and biopsy results pending at the time of your discharge. Please request that your primary care M.D. goes through all the records of your hospital data and follows up on these results.  Also Note the following: If you experience worsening of your admission symptoms, develop shortness of breath, life threatening emergency, suicidal or homicidal thoughts you must seek medical attention immediately by calling 911 or calling your MD immediately  if symptoms less severe.  You must read  complete instructions/literature along with all the possible adverse reactions/side effects for all the Medicines you take and that have been prescribed to you. Take any new Medicines after you have completely understood and accpet all the possible adverse reactions/side effects.   Do not drive when taking Pain medications or sleeping medications (Benzodaizepines)  Do not take more than prescribed Pain, Sleep and Anxiety Medications. It is not advisable to combine anxiety,sleep and pain medications without talking with your primary care practitioner  Special Instructions: If you have smoked or chewed Tobacco  in the last 2 yrs please stop smoking, stop any regular Alcohol  and or any Recreational drug use.  Wear Seat belts while driving.  Please note: You were cared for by a hospitalist during your hospital stay. Once you are discharged, your primary care physician will handle any further medical issues. Please note that NO REFILLS for any discharge medications will be authorized once you are discharged, as it is imperative that you return to your primary care physician (or establish  a relationship with a primary care physician if you do not have one) for your post hospital discharge needs so that they can reassess your need for medications and monitor your lab values.  Total Time spent coordinating discharge including counseling, education and face to face time equals 35 minutes.  Signed: Xandra Laramee 06/26/2019 10:15 AM

## 2019-06-26 NOTE — Progress Notes (Signed)
Pt declines rn phone call to family for updates. "theres no need, they just called today, nothings changed"

## 2019-06-26 NOTE — Plan of Care (Signed)

## 2019-06-28 NOTE — Care Management (Signed)
06/28/19- 9:33am Case manager contacted [employer] concerning getting patient's oxygen concentrator and tanks delivered. Case manager was not notified of patient's need prior to his discharge. Patient is aware of planned delivery.Order is in chart.     Ricki Miller, RN Case Manager [phone]

## 2019-06-30 ENCOUNTER — Encounter (HOSPITAL_COMMUNITY): Payer: Self-pay

## 2019-06-30 ENCOUNTER — Emergency Department (HOSPITAL_COMMUNITY): Payer: Medicaid Other

## 2019-06-30 ENCOUNTER — Emergency Department (HOSPITAL_COMMUNITY)
Admission: EM | Admit: 2019-06-30 | Discharge: 2019-06-30 | Disposition: A | Discharge status: Home / Self Care | Payer: Medicaid Other | Attending: Emergency Medicine | Admitting: Emergency Medicine

## 2019-06-30 ENCOUNTER — Other Ambulatory Visit: Payer: Self-pay

## 2019-06-30 DIAGNOSIS — U071 COVID-19: Secondary | ICD-10-CM | POA: Insufficient documentation

## 2019-06-30 DIAGNOSIS — R109 Unspecified abdominal pain: Secondary | ICD-10-CM | POA: Diagnosis present

## 2019-06-30 DIAGNOSIS — Z79899 Other long term (current) drug therapy: Secondary | ICD-10-CM | POA: Insufficient documentation

## 2019-06-30 DIAGNOSIS — Z7984 Long term (current) use of oral hypoglycemic drugs: Secondary | ICD-10-CM | POA: Diagnosis not present

## 2019-06-30 DIAGNOSIS — R1084 Generalized abdominal pain: Secondary | ICD-10-CM | POA: Insufficient documentation

## 2019-06-30 DIAGNOSIS — I10 Essential (primary) hypertension: Secondary | ICD-10-CM | POA: Insufficient documentation

## 2019-06-30 DIAGNOSIS — Z7982 Long term (current) use of aspirin: Secondary | ICD-10-CM | POA: Diagnosis not present

## 2019-06-30 DIAGNOSIS — K529 Noninfective gastroenteritis and colitis, unspecified: Secondary | ICD-10-CM | POA: Insufficient documentation

## 2019-06-30 HISTORY — DX: COVID-19: U07.1

## 2019-06-30 LAB — COMPREHENSIVE METABOLIC PANEL
ALT: 59 U/L — ABNORMAL HIGH (ref 0–44)
AST: 29 U/L (ref 15–41)
Albumin: 3.9 g/dL (ref 3.5–5.0)
Alkaline Phosphatase: 53 U/L (ref 38–126)
Anion gap: 10 (ref 5–15)
BUN: 22 mg/dL (ref 8–23)
CO2: 27 mmol/L (ref 22–32)
Calcium: 9.1 mg/dL (ref 8.9–10.3)
Chloride: 99 mmol/L (ref 98–111)
Creatinine, Ser: 0.97 mg/dL (ref 0.61–1.24)
GFR calc Af Amer: >60 mL/min (ref >60)
GFR calc non Af Amer: >60 mL/min (ref >60)
Glucose, Bld: 87 mg/dL (ref 70–99)
Potassium: 4.3 mmol/L (ref 3.5–5.1)
Sodium: 136 mmol/L (ref 135–145)
Total Bilirubin: 1 mg/dL (ref 0.3–1.2)
Total Protein: 7.3 g/dL (ref 6.5–8.1)

## 2019-06-30 LAB — CBC WITH DIFFERENTIAL/PLATELET
Abs Immature Granulocytes: 0.76 10*3/uL — ABNORMAL HIGH (ref 0.00–0.07)
Basophils Absolute: 0.1 10*3/uL (ref 0.0–0.1)
Basophils Relative: 1 %
Eosinophils Absolute: 0.3 10*3/uL (ref 0.0–0.5)
Eosinophils Relative: 1 %
HCT: 54.9 % — ABNORMAL HIGH (ref 39.0–52.0)
Hemoglobin: 17.6 g/dL — ABNORMAL HIGH (ref 13.0–17.0)
Immature Granulocytes: 4 %
Lymphocytes Relative: 10 %
Lymphs Abs: 1.9 10*3/uL (ref 0.7–4.0)
MCH: 27 pg (ref 26.0–34.0)
MCHC: 32.1 g/dL (ref 30.0–36.0)
MCV: 84.3 fL (ref 80.0–100.0)
Monocytes Absolute: 0.7 10*3/uL (ref 0.1–1.0)
Monocytes Relative: 4 %
Neutro Abs: 14.9 10*3/uL — ABNORMAL HIGH (ref 1.7–7.7)
Neutrophils Relative %: 80 %
Platelets: 228 10*3/uL (ref 150–400)
RBC: 6.51 MIL/uL — ABNORMAL HIGH (ref 4.22–5.81)
RDW: 14.8 % (ref 11.5–15.5)
WBC: 18.6 10*3/uL — ABNORMAL HIGH (ref 4.0–10.5)
nRBC: 0 % (ref 0.0–0.2)

## 2019-06-30 LAB — LACTIC ACID, PLASMA: Lactic Acid, Venous: 2 mmol/L (ref 0.5–1.9)

## 2019-06-30 LAB — LIPASE, BLOOD: Lipase: 83 U/L — ABNORMAL HIGH (ref 11–51)

## 2019-06-30 MED ORDER — DICYCLOMINE HCL 20 MG PO TABS: 20.0000 mg | ORAL_TABLET | Freq: Two times a day (BID) | ORAL | 0 refills | Status: AC | PRN

## 2019-06-30 MED ORDER — SODIUM CHLORIDE 0.9 % IV BOLUS
500.0000 mL | Freq: Once | INTRAVENOUS | Status: AC
  Administered 2019-06-30: 500 mL via INTRAVENOUS

## 2019-06-30 MED ORDER — ONDANSETRON 4 MG PO TBDP: 4.0000 mg | ORAL_TABLET | Freq: Three times a day (TID) | ORAL | 0 refills | Status: DC | PRN

## 2019-06-30 MED ORDER — SODIUM CHLORIDE (PF) 0.9 % IJ SOLN
INTRAMUSCULAR | Status: AC
  Filled 2019-06-30: qty 50

## 2019-06-30 MED ORDER — MORPHINE SULFATE (PF) 4 MG/ML IV SOLN
4.0000 mg | Freq: Once | INTRAVENOUS | Status: AC
  Administered 2019-06-30: 4 mg via INTRAVENOUS
  Filled 2019-06-30: qty 1

## 2019-06-30 MED ORDER — IOHEXOL 300 MG/ML  SOLN
100.0000 mL | Freq: Once | INTRAMUSCULAR | Status: AC | PRN
  Administered 2019-06-30: 100 mL via INTRAVENOUS

## 2019-06-30 MED ORDER — HYDROCODONE-ACETAMINOPHEN 5-325 MG PO TABS: 2.0000 | ORAL_TABLET | ORAL | 0 refills | Status: DC | PRN

## 2019-06-30 MED ORDER — HYDROCODONE-ACETAMINOPHEN 5-325 MG PO TABS: 1.0000 | ORAL_TABLET | Freq: Four times a day (QID) | ORAL | 0 refills | Status: DC | PRN

## 2019-06-30 NOTE — ED Provider Notes (Addendum)
[facility]-EMERGENCY DEPT Provider Note   CSN: [id number] Arrival date & time: 06/30/19  4098     History   Chief Complaint Chief Complaint  Patient presents with  . COVID +  . Abdominal Pain    HPI Michael Steele is a 78 y.o. male.     HPI  78yo male with history of hypertension, hyperlipidemia, atrial fibrillation, recent admission for COVID19 presents with concern for abdominal pain.  Reports symptoms began around midnight. Began in top of abdomen then radiated diffusely. Severe pain, somewhat improved now but still present.  No nausea, vomiting, diarrhea, constipation. Has been eating ok.  No fever, no urinary symptoms.      Past Medical History:  Diagnosis Date  . COVID-19   . GERD (gastroesophageal reflux disease)   . Hyperlipidemia   . Hypertension     Patient Active Problem List   Diagnosis Date Noted  . A-fib (HCC) 06/22/2019  . Acute respiratory disease due to COVID-19 virus 06/21/2019  . Acute hypoxemic respiratory failure due to COVID-19 (HCC) 06/21/2019  . Hyponatremia 06/21/2019  . Dehydration 06/21/2019  . HTN (hypertension) 06/21/2019  . Hyperlipidemia 06/21/2019  . GERD (gastroesophageal reflux disease) 06/21/2019  . Pneumonia due to COVID-19 virus     History reviewed. No pertinent surgical history.      Home Medications    Prior to Admission medications   Medication Sig Start Date End Date Taking? Authorizing Provider  amLODipine (NORVASC) 2.5 MG tablet Take 2.5 mg by mouth 2 (two) times daily. 06/07/19  Yes [provider]  aspirin EC 81 MG tablet Take 1 tablet (81 mg total) by mouth daily. 06/26/19 06/25/20 Yes Ghimire, Werner Lean, MD  benzonatate (TESSALON) 200 MG capsule Take 1 capsule (200 mg total) by mouth 3 (three) times daily as needed for cough. 06/26/19  Yes Ghimire, Werner Lean, MD  cetirizine (ZYRTEC) 5 MG tablet Take 5 mg by mouth daily as needed for allergies.   Yes [provider]   fenofibrate (TRICOR) 48 MG tablet Take 48 mg by mouth daily. 06/07/19  Yes [provider]  metFORMIN (GLUCOPHAGE) 500 MG tablet Take 1 tablet (500 mg total) by mouth 2 (two) times daily with a meal. 06/26/19 06/25/20 Yes Ghimire, Werner Lean, MD  omeprazole (PRILOSEC) 20 MG capsule Take 20 mg by mouth daily. 05/25/19  Yes [provider]  predniSONE (DELTASONE) 10 MG tablet Take 40 mg daily for 1 day, 30 mg daily for 1 day, 20 mg daily for 1 days,10 mg daily for 1 day, then stop 06/26/19  Yes Ghimire, Werner Lean, MD  dicyclomine (BENTYL) 20 MG tablet Take 1 tablet (20 mg total) by mouth 2 (two) times daily as needed for spasms. 06/30/19   Alvira Monday, MD  HYDROcodone-acetaminophen (NORCO/VICODIN) 5-325 MG tablet Take 1 tablet by mouth every 6 (six) hours as needed. 06/30/19   Charlynne Pander, MD  ondansetron (ZOFRAN ODT) 4 MG disintegrating tablet Take 1 tablet (4 mg total) by mouth every 8 (eight) hours as needed for nausea or vomiting. 06/30/19   Alvira Monday, MD    Family History History reviewed. No pertinent family history.  Social History Social History   Tobacco Use  . Smoking status: Never Smoker  . Smokeless tobacco: Never Used  Substance Use Topics  . Alcohol use: Yes    Comment: socially  . Drug use: Never     Allergies   Patient has no known allergies.   Review of Systems  Review of Systems  Constitutional: Positive for fatigue. Negative for fever.  HENT: Negative for sore throat.   Eyes: Negative for visual disturbance.  Respiratory: Negative for shortness of breath.   Cardiovascular: Negative for chest pain.  Gastrointestinal: Positive for abdominal pain. Negative for constipation, diarrhea, nausea and vomiting.  Genitourinary: Negative for difficulty urinating.  Musculoskeletal: Negative for back pain and neck stiffness.  Skin: Negative for rash.  Neurological: Negative for syncope and headaches.     Physical Exam Updated Vital Signs  BP 114/77   Pulse (!) 103   Temp 97.6 F (36.4 C) (Oral)   Resp (!) 24   Ht 5\' 5"  (1.651 m)   Wt 74.3 kg   SpO2 95%   BMI 27.26 kg/m   Physical Exam Vitals signs and nursing note reviewed.  Constitutional:      General: He is not in acute distress.    Appearance: He is well-developed. He is not diaphoretic.  HENT:     Head: Normocephalic and atraumatic.  Eyes:     Conjunctiva/sclera: Conjunctivae normal.  Neck:     Musculoskeletal: Normal range of motion.  Cardiovascular:     Rate and Rhythm: Normal rate and regular rhythm.     Heart sounds: No gallop.   Pulmonary:     Effort: Pulmonary effort is normal. No respiratory distress.     Breath sounds: Normal breath sounds. No wheezing or rales.  Abdominal:     General: There is distension.     Palpations: Abdomen is soft.     Tenderness: There is abdominal tenderness (diffuse). There is no guarding.  Skin:    General: Skin is warm and dry.  Neurological:     Mental Status: He is alert and oriented to person, place, and time.      ED Treatments / Results  Labs (all labs ordered are listed, but only abnormal results are displayed) Labs Reviewed  COMPREHENSIVE METABOLIC PANEL - Abnormal; Notable for the following components:      Result Value   ALT 59 (*)    All other components within normal limits  LACTIC ACID, PLASMA - Abnormal; Notable for the following components:   Lactic Acid, Venous 2.0 (*)    All other components within normal limits  CBC WITH DIFFERENTIAL/PLATELET - Abnormal; Notable for the following components:   WBC 18.6 (*)    RBC 6.51 (*)    Hemoglobin 17.6 (*)    HCT 54.9 (*)    Neutro Abs 14.9 (*)    Abs Immature Granulocytes 0.76 (*)    All other components within normal limits  LIPASE, BLOOD - Abnormal; Notable for the following components:   Lipase 83 (*)    All other components within normal limits    EKG None  Radiology Ct Abdomen Pelvis W Contrast  Result Date: 06/30/2019 CLINICAL  DATA:  Concern for bowel obstruction. Abdominal pain. History of COVID infection. EXAM: CT ABDOMEN AND PELVIS WITH CONTRAST TECHNIQUE: Multidetector CT imaging of the abdomen and pelvis was performed using the standard protocol following bolus administration of intravenous contrast. CONTRAST:  14/11/2018 OMNIPAQUE IOHEXOL 300 MG/ML  SOLN COMPARISON:  01/15/2016, FINDINGS: Lower chest: Patchy bilateral opacities at the lung bases worse on the left than the right. No signs of pleural effusion. No dense consolidation. No pericardial fluid. Hepatobiliary: Probable background hepatic end of sentence post cholecystectomy without biliary ductal distension. Steatosis. Pancreas: Unremarkable. No pancreatic ductal dilatation or surrounding inflammatory changes. Spleen: Normal in size without focal abnormality. Adrenals/Urinary  Tract: Normal adrenal glands. Symmetric enhancement of the bilateral kidneys without signs of hydronephrosis. Stomach/Bowel: Signs of colonic diverticulosis. No signs of acute diverticulitis. Normal appendix. Moderate segmental distension of the mid to distal ileum in the right hemiabdomen with gradual transition measuring approximately 3.4 cm in greatest caliber, wall enhancement is preserved. No signs of perienteric stranding. Vascular/Lymphatic: Scattered calcified atherosclerotic and noncalcified atherosclerotic plaque throughout the abdominal aorta. No signs of aneurysm. Retroaortic left renal vein. Patent SMV and portal vein. No signs of adenopathy in the abdomen or in the pelvis. It has Reproductive: Mild prostatomegaly. Other: No abdominal wall hernia or abnormality. No abdominopelvic ascites. Musculoskeletal: Signs of spinal degenerative change, no signs of acute or destructive bone finding. IMPRESSION: 1. Moderate segmental distension of the mid to distal ileum in the right hemiabdomen with gradual transition measuring approximately 3.4 cm in greatest caliber. No signs of perienteric stranding.  Findings are of uncertain significance and may be related to mild enteritis or focal ileus. Attention on follow-up is suggested given recent COVID infection and gastrointestinal symptoms. Close follow-up may be warranted if there are continued GI symptoms. 2. Signs of parenchymal disease in the lungs likely related to history of COVID infection, based on comparison with previous chest radiograph likely improved since 06/23/2019. 3. Hepatic steatosis. 4. Signs of colonic diverticulosis without signs of acute diverticulitis. 5. Aortic atherosclerosis. Aortic Atherosclerosis (ICD10-I70.0). Electronically Signed   By: Zetta Bills M.D.   On: 06/30/2019 14:03    Procedures Procedures (including critical care time)  Medications Ordered in ED Medications  sodium chloride 0.9 % bolus 500 mL (0 mLs Intravenous Stopped 06/30/19 1403)  morphine 4 MG/ML injection 4 mg (4 mg Intravenous Given 06/30/19 1211)  iohexol (OMNIPAQUE) 300 MG/ML solution 100 mL (100 mLs Intravenous Contrast Given 06/30/19 1329)     Initial Impression / Assessment and Plan / ED Course  I have reviewed the triage vital signs and the nursing notes.  Pertinent labs & imaging results that were available during my care of the patient were reviewed by me and considered in my medical decision making (see chart for details).         78yo male with history of hypertension, hyperlipidemia, atrial fibrillation, recent admission for COVID19 presents with concern for abdominal pain.  No urinary symptoms, no n/v/no flank pain, doubt UTI/pyelonephritis.    CT abdomen pelvis shows area of dilation, focal enteritis versus focal ileus, no signs of SBO. Doubt mesenteric ischemia. Given COVID 19 infection, feel most likely etiology is viral enteritis, however discussed if symptoms continue recommend PCP follow up, consider GI referral.  Discussed if symptoms worsen, develops n/v/d, fever or worsening symptoms should return to ED.  Given short rx for  norco after review in [place] drug database and rx for bentyl. Discussed risks. Patient discharged in stable condition with understanding of reasons to return.     Final Clinical Impressions(s) / ED Diagnoses   Final diagnoses:  Generalized abdominal pain  Enteritis  COVID-19    ED Discharge Orders         Ordered    dicyclomine (BENTYL) 20 MG tablet  2 times daily PRN     06/30/19 1528    HYDROcodone-acetaminophen (NORCO/VICODIN) 5-325 MG tablet  Every 4 hours PRN,   Status:  Discontinued     06/30/19 1528    ondansetron (ZOFRAN ODT) 4 MG disintegrating tablet  Every 8 hours PRN     06/30/19 1528    HYDROcodone-acetaminophen (NORCO/VICODIN) 5-325 MG tablet  Every 6 hours PRN     06/30/19 1633              Alvira MondaySchlossman, Edmund Rick, MD 06/30/19 2300

## 2019-06-30 NOTE — ED Triage Notes (Signed)
Patient is AOx4 and ambulatory, patient is on 2L nasal canula at at baseline. Patient arrived via POV. Patient chief complaint is abd pain that began at 0000 this morning. No nausea or vomiting reported. Patient is tender in all 4 quadrants. Patient was recently released from [facility] for COVID+.

## 2019-07-06 ENCOUNTER — Other Ambulatory Visit: Payer: Self-pay

## 2019-07-06 DIAGNOSIS — Z20822 Contact with and (suspected) exposure to covid-19: Secondary | ICD-10-CM

## 2019-07-08 LAB — NOVEL CORONAVIRUS, NAA: SARS-CoV-2, NAA: NOT DETECTED

## 2019-07-10 ENCOUNTER — Telehealth: Payer: Self-pay

## 2019-07-10 ENCOUNTER — Telehealth: Payer: Self-pay | Admitting: *Deleted

## 2019-07-10 ENCOUNTER — Encounter: Payer: Self-pay | Admitting: *Deleted

## 2019-07-10 NOTE — Telephone Encounter (Signed)
Assisted by Myriam Forehand, Interpreter # [phone]; attempted to contact pt regarding his COVID results; message sent via MyChart incorrectly stating the pt was "positive"; left messages on voicemail in [place] and Arabic; will also send message via [place].

## 2019-07-10 NOTE — Telephone Encounter (Signed)
Spoke with patient regarding the covid results. Patient verbalized understanding

## 2019-07-10 NOTE — Telephone Encounter (Signed)
Assisted by Myriam Forehand, Interpreter # [phone]; contacted pt's wife; however she did not feel comfortable giving information to verify his chart; she did verify his phone number, and states he can be contacted via [place];  Message sent to pt via [place], "Michael Steele,  This message is to inform you that you were incorrectly informed via [place] on 07/09/2019 stating ""You have viewed your result on mychart.  Your COVID test is "detected" or considered positive" . Your test on 07/06/2019 was negative for COVID. If you have additional questions please call [phone]. You may speak with any nurse that answers. Please accept apologies for any inconvenience this may have caused"; [facility] Dept was not notified.

## 2019-07-12 ENCOUNTER — Telehealth: Payer: Self-pay | Admitting: *Deleted

## 2019-07-12 NOTE — Telephone Encounter (Signed)
Arabic interpreter [phone] [employer] to ship a 30 day cardiac event monitor to the patient home.  Patient can call [employer] at [phone] and ask for an Arabic interpreter if he needs assistance with instruction.  Patient states daughter, Lawerance Bach, will be able to assist him with monitor.  Instructions included in the monitor kit.

## 2019-08-23 ENCOUNTER — Encounter: Payer: Self-pay | Admitting: *Deleted

## 2019-08-23 NOTE — Progress Notes (Signed)
Patient ID: Michael Steele, male   DOB: March 04, 1941, 79 y.o.   MRN: [id number] 08/22/2019 Received cancellation notification from [employer]. Patient requested cancellation of monitor enrolled 07/12/2019.

## 2019-09-24 ENCOUNTER — Ambulatory Visit: Payer: Medicaid Other | Attending: Internal Medicine

## 2019-09-24 DIAGNOSIS — Z23 Encounter for immunization: Secondary | ICD-10-CM | POA: Insufficient documentation

## 2019-09-24 NOTE — Progress Notes (Signed)
   Covid-19 Vaccination Clinic  Name:  Michael Steele    MRN: [id number] DOB: 04/08/1941  09/24/2019  Mr. Michael Steele was observed post Covid-19 immunization for 15 minutes without incidence. He was provided with Vaccine Information Sheet and instruction to access the V-Safe system.   Mr. Michael Steele was instructed to call 911 with any severe reactions post vaccine: Marland Kitchen Difficulty breathing  . Swelling of your face and throat  . A fast heartbeat  . A bad rash all over your body  . Dizziness and weakness    Immunizations Administered    Name Date Dose VIS Date Route   Pfizer COVID-19 Vaccine 09/24/2019  9:17 AM 0.3 mL 07/06/2019 Intramuscular   Manufacturer: [employer], [employer]   Lot: [id number]   NDC: [address]-1

## 2019-10-17 ENCOUNTER — Ambulatory Visit: Payer: Medicaid Other | Attending: Internal Medicine

## 2019-10-17 DIAGNOSIS — Z23 Encounter for immunization: Secondary | ICD-10-CM

## 2019-10-17 NOTE — Progress Notes (Signed)
   Covid-19 Vaccination Clinic  Name:  Michael Steele    MRN: [id number] DOB: [birth year]  10/17/2019  Michael Steele was observed post Covid-19 immunization for 15 minutes without incident. He was provided with Vaccine Information Sheet and instruction to access the V-Safe system.   Michael Steele was instructed to call 911 with any severe reactions post vaccine: Marland Kitchen Difficulty breathing  . Swelling of face and throat  . A fast heartbeat  . A bad rash all over body  . Dizziness and weakness   Immunizations Administered    Name Date Dose VIS Date Route   Pfizer COVID-19 Vaccine 10/17/2019  9:10 AM 0.3 mL 07/06/2019 Intramuscular   Manufacturer: [employer], [employer]   Lot: [id number]   NDC: [address]-3

## 2019-10-23 ENCOUNTER — Ambulatory Visit: Payer: Medicaid Other

## 2019-10-25 ENCOUNTER — Ambulatory Visit: Payer: Medicaid Other | Admitting: Cardiology

## 2019-11-12 ENCOUNTER — Ambulatory Visit: Payer: Medicaid Other | Attending: Internal Medicine

## 2019-11-12 ENCOUNTER — Ambulatory Visit: Payer: Medicaid Other | Admitting: Cardiology

## 2019-11-12 DIAGNOSIS — Z20822 Contact with and (suspected) exposure to covid-19: Secondary | ICD-10-CM

## 2019-11-13 LAB — NOVEL CORONAVIRUS, NAA: SARS-CoV-2, NAA: NOT DETECTED

## 2019-11-13 LAB — SARS-COV-2, NAA 2 DAY TAT

## 2019-12-27 NOTE — Progress Notes (Deleted)
Date:  12/27/2019   ID:  Carolynn Sayers, DOB [birth year], MRN [id number]  PCP:  [facility] (Inactive)  Cardiologist:  Rex Kras, DO, [facility] Former Cardiology Providers: *** Cardiothoracic Surgeon: *** Electrophysiologist:  None   REASON FOR CONSULT: ***  REQUESTING PHYSICIAN:  Trey Sailors, PA [address] [place],  [place] [address]  No chief complaint on file.   HPI  Michael Steele is a 79 y.o. male who is being seen today for the evaluation of *** at the request of Trey Sailors, [place]. Patient's past medical history and cardiac risk factors include: ***  Patient is unaccompanied at today's office visit.  Patient is accompanied by *** at today's office visit.    ***  History of  Denies prior history of coronary artery disease, myocardial infarction, congestive heart failure, deep venous thrombosis, pulmonary embolism, stroke, transient ischemic attack.  FUNCTIONAL STATUS: ***   ALLERGIES: No Known Allergies  MEDICATION LIST PRIOR TO VISIT: No outpatient medications have been marked as taking for the 12/28/19 encounter (Appointment) with Rex Kras, DO.     PAST MEDICAL HISTORY: Past Medical History:  Diagnosis Date  . COVID-19   . GERD (gastroesophageal reflux disease)   . Hyperlipidemia   . Hypertension     PAST SURGICAL HISTORY: No past surgical history on file.  FAMILY HISTORY: The patient family history is not on file.  SOCIAL HISTORY:  The patient  reports that he has never smoked. He has never used smokeless tobacco. He reports current alcohol use. He reports that he does not use drugs.  REVIEW OF SYSTEMS: ROS  PHYSICAL EXAM: Vitals with BMI 06/30/2019 06/30/2019 06/30/2019  Height - - -  Weight - - -  BMI - - -  Systolic 191 478 295  Diastolic 77 86 66  Pulse 621 87 78    CONSTITUTIONAL: Well-developed and well-nourished. No acute distress.  SKIN: Skin is warm and dry. No rash noted. No cyanosis. No  pallor. No jaundice HEAD: Normocephalic and atraumatic.  EYES: No scleral icterus MOUTH/THROAT: Moist oral membranes.  NECK: No JVD present. No thyromegaly noted. No carotid bruits  LYMPHATIC: No visible cervical adenopathy.  CHEST Normal respiratory effort. No intercostal retractions  LUNGS: *** No stridor. No wheezes. No rales.  CARDIOVASCULAR: *** ABDOMINAL: No apparent ascites.  EXTREMITIES: No peripheral edema  HEMATOLOGIC: No significant bruising NEUROLOGIC: Oriented to person, place, and time. Nonfocal. Normal muscle tone.  PSYCHIATRIC: Normal mood and affect. Normal behavior. Cooperative  RADIOLOGY: ***  CT Abdomen 06/30/2019: 1. Moderate segmental distension of the mid to distal ileum in the right hemiabdomen with gradual transition measuring approximately 3.4 cm in greatest caliber. No signs of perienteric stranding. Findings are of uncertain significance and may be related to mild enteritis or focal ileus.  Attention on follow-up is suggested given recent COVID infection and gastrointestinal symptoms.  Close follow-up may be warranted if there are continued GI symptoms. 2. Signs of parenchymal disease in the lungs likely related to history of COVID infection, based on comparison with previous chest radiograph likely improved since 06/23/2019. 3. Hepatic steatosis. 4. Signs of colonic diverticulosis without signs of acute diverticulitis. 5. Aortic atherosclerosis.   Chest X-Ray 06/23/2019: Clinical Data: Shortness of breath. COVID-19 positive. 1. Increasing extent of pneumonia within the LEFT lung, now seen along the periphery of the LEFT upper lobe, again most confluentwithin the LEFT lower lobe. 2. Probable newly visible pneumonia within the periphery of the RIGHT upper  lobe, but not as convincing as the LEFT.   CARDIAC DATABASE: Coronary artery bypass grafting: Year: Surgical anatomy:  Valve surgery:  Pacemaker/ICD in situ:  EKG: *** 06/22/2019: Atrial  fibrillation Inferior infarct , age undetermined Abnormal ECG   Echocardiogram: *** 06/22/2019: 1. Left ventricular ejection fraction, by visual estimation, is 55 to 60%. The left ventricle has normal function. There is no left ventricular hypertrophy.  2. Global right ventricle has normal systolic function.The right ventricular size is normal. No increase in right ventricular wall thickness.  3. Left atrial size was mildly dilated.  4. Right atrial size was normal.  5. The mitral valve is normal in structure. No evidence of mitral valve regurgitation. No evidence of mitral stenosis.  6. The tricuspid valve is normal in structure. Tricuspid valve regurgitation is trivial.  7. The aortic valve is normal in structure. Aortic valve regurgitation is not visualized. No evidence of aortic valve sclerosis or stenosis.  8. The pulmonic valve was normal in structure. Pulmonic valve regurgitation is not visualized.  9. TR signal is inadequate for assessing pulmonary artery systolic pressure.  10. The inferior vena cava is normal in size with greater than 50% respiratory variability, suggesting right atrial pressure of 3 mmHg.    Stress Testing: ***  Heart Catheterization: ***  Carotid duplex: ***  Vascular imaging: US Abdomen (AAA screening): Ultrasound arterial duplex: Ultrasound venous duplex:   Holter:  14-Day mobile cardiac ambulatory telemetry:  LABORATORY DATA: CBC Latest Ref Rng & Units 06/30/2019 06/26/2019 06/25/2019  WBC 4.0 - 10.5 K/uL 18.6(H) 8.2 7.0  Hemoglobin 13.0 - 17.0 g/dL 17.6(H) 14.5 15.1  Hematocrit 39.0 - 52.0 % 54.9(H) 44.3 46.2  Platelets 150 - 400 K/uL 228 218 219    CMP Latest Ref Rng & Units 06/30/2019 06/26/2019 06/25/2019  Glucose 70 - 99 mg/dL 87 123(H) 120(H)  BUN 8 - 23 mg/dL 22 36(H) 25(H)  Creatinine 0.61 - 1.24 mg/dL 0.97 1.07 0.92  Sodium 135 - 145 mmol/L 136 137 139  Potassium 3.5 - 5.1 mmol/L 4.3 4.2 4.1  Chloride 98 - 111 mmol/L 99 99 101    CO2 22 - 32 mmol/L 27 26 26   Calcium 8.9 - 10.3 mg/dL 9.1 8.5(L) 8.5(L)  Total Protein 6.5 - 8.1 g/dL 7.3 6.1(L) 6.6  Total Bilirubin 0.3 - 1.2 mg/dL 1.0 0.6 0.9  Alkaline Phos 38 - 126 U/L 53 44 43  AST 15 - 41 U/L 29 59(H) 82(H)  ALT 0 - 44 U/L 59(H) 96(H) 97(H)    Lipid Panel     Component Value Date/Time   TRIG 157 (H) 06/21/2019 1146    Lab Results  Component Value Date   HGBA1C 6.6 (H) 06/24/2019   No components found for: NTPROBNP Lab Results  Component Value Date   TSH 0.356 06/22/2019    BMP Recent Labs    06/25/19 0142 06/26/19 0225 06/30/19 1040  NA 139 137 136  K 4.1 4.2 4.3  CL 101 99 99  CO2 26 26 27   GLUCOSE 120* 123* 87  BUN 25* 36* 22  CREATININE 0.92 1.07 0.97  CALCIUM 8.5* 8.5* 9.1  GFRNONAA >60 >60 >60  GFRAA >60 >60 >60    CBC No results for input(s): WBC, RBC, HGB, HCT, PLT, MCV, MCH, MCHC, RDW, LYMPHSABS, MONOABS, EOSABS, BASOSABS in the last 168 hours.  Invalid input(s): NEUTRABS  HEMOGLOBIN A1C Lab Results  Component Value Date   HGBA1C 6.6 (H) 06/24/2019   MPG 142.72 06/24/2019  Cardiac Panel (last 3 results) No results for input(s): CKTOTAL, CKMB, TROPONINI, RELINDX in the last 8760 hours. No results for input(s): TROPIPOC in the last 8760 hours.  BNP (last 3 results) No results for input(s): PROBNP in the last 8760 hours.  TSH Recent Labs    06/22/19 0618  TSH 0.356    CHOLESTEROL No results for input(s): CHOL in the last 8760 hours.  Hepatic Function Panel Recent Labs    06/25/19 0142 06/26/19 0225 06/30/19 1040  PROT 6.6 6.1* 7.3  ALBUMIN 3.4* 3.1* 3.9  AST 82* 59* 29  ALT 97* 96* 59*  ALKPHOS 43 44 53  BILITOT 0.9 0.6 1.0   *** External Labs: 09/04/2019: Glucose 164, BUN/Cr 24/0.92, EGFR 81/93, Na/K 140/3.7. ALT 16, AST 16, Bili Total 0.4, Alk Phos 50, Ca 9.2,   01/2018: Chol 216, TG 236, HDL 39, LDL 130   IMPRESSION:  No diagnosis found.   RECOMMENDATIONS: Michael Steele is a 79  y.o. male whose past medical history and cardiac risk factors include: ***   FINAL MEDICATION LIST END OF ENCOUNTER: No orders of the defined types were placed in this encounter.   There are no discontinued medications.   Current Outpatient Medications:  .  amLODipine (NORVASC) 2.5 MG tablet, Take 2.5 mg by mouth 2 (two) times daily., Disp: , Rfl:  .  aspirin EC 81 MG tablet, Take 1 tablet (81 mg total) by mouth daily., Disp: 150 tablet, Rfl: 2 .  benzonatate (TESSALON) 200 MG capsule, Take 1 capsule (200 mg total) by mouth 3 (three) times daily as needed for cough., Disp: 20 capsule, Rfl: 0 .  cetirizine (ZYRTEC) 5 MG tablet, Take 5 mg by mouth daily as needed for allergies., Disp: , Rfl:  .  dicyclomine (BENTYL) 20 MG tablet, Take 1 tablet (20 mg total) by mouth 2 (two) times daily as needed for spasms., Disp: 20 tablet, Rfl: 0 .  fenofibrate (TRICOR) 48 MG tablet, Take 48 mg by mouth daily., Disp: , Rfl:  .  HYDROcodone-acetaminophen (NORCO/VICODIN) 5-325 MG tablet, Take 1 tablet by mouth every 6 (six) hours as needed., Disp: 8 tablet, Rfl: 0 .  metFORMIN (GLUCOPHAGE) 500 MG tablet, Take 1 tablet (500 mg total) by mouth 2 (two) times daily with a meal., Disp: 60 tablet, Rfl: 0 .  omeprazole (PRILOSEC) 20 MG capsule, Take 20 mg by mouth daily., Disp: , Rfl:  .  ondansetron (ZOFRAN ODT) 4 MG disintegrating tablet, Take 1 tablet (4 mg total) by mouth every 8 (eight) hours as needed for nausea or vomiting., Disp: 20 tablet, Rfl: 0 .  predniSONE (DELTASONE) 10 MG tablet, Take 40 mg daily for 1 day, 30 mg daily for 1 day, 20 mg daily for 1 days,10 mg daily for 1 day, then stop, Disp: 10 tablet, Rfl: 0  No orders of the defined types were placed in this encounter.   There are no Patient Instructions on file for this visit.   --Continue cardiac medications as reconciled in final medication list. --No follow-ups on file. Or sooner if needed. --Continue follow-up with your primary care physician  regarding the management of your other chronic comorbid conditions.  Patient's questions and concerns were addressed to his satisfaction. He voices understanding of the instructions provided during this encounter.   This note was created using a voice recognition software as a result there may be grammatical errors inadvertently enclosed that do not reflect the nature of this encounter. Every attempt is made to correct  such errors.  Rex Kras, [place], [facility]  Pager: [phone] Office: [phone]

## 2019-12-28 ENCOUNTER — Ambulatory Visit: Payer: Medicaid Other | Admitting: Cardiology

## 2020-05-02 IMAGING — CT CT ABD-PELV W/ CM
2 of 5 series · 16 of 46 positions shown, 18 images · IV contrast (omnipaque)
Comparison: 01/15/2016,

CLINICAL DATA: Concern for bowel obstruction. Abdominal pain.
History of COVID infection.

EXAM:
CT ABDOMEN AND PELVIS WITH CONTRAST
TECHNIQUE: Multidetector CT imaging of the abdomen and pelvis was performed
using the standard protocol following bolus administration of
intravenous contrast.
CONTRAST:  100mL OMNIPAQUE IOHEXOL 300 MG/ML  SOLN

[Series 2: axial st · axial · 0.76mm/px · z∈[-401,-41]mm · 13 of 85 slices shown, 15 images]
[im 7/85  soft-tissue]
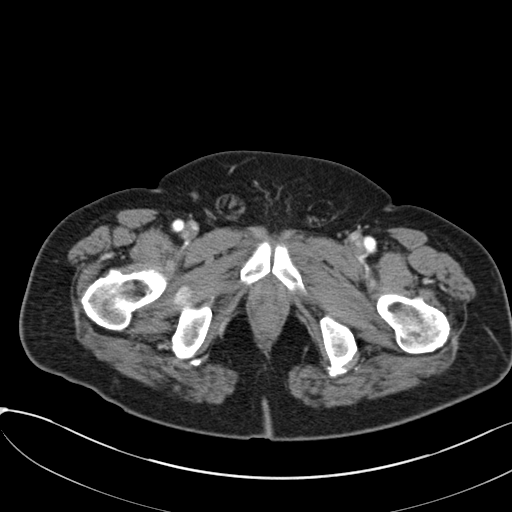
[im 7/85  bone]
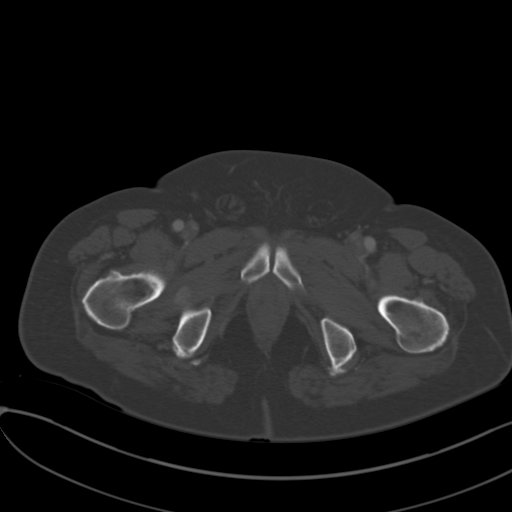
[im 13/85  soft-tissue]
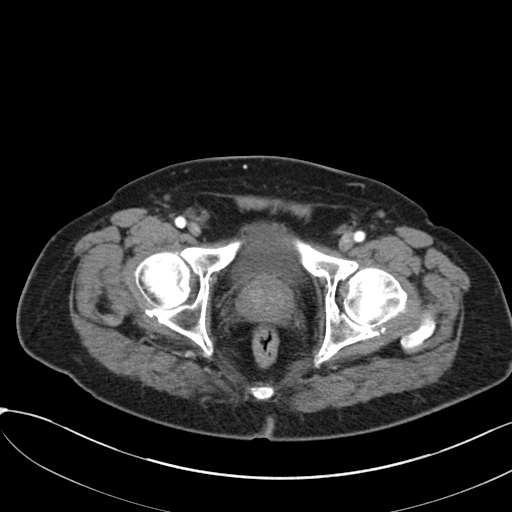
[im 19/85  soft-tissue]
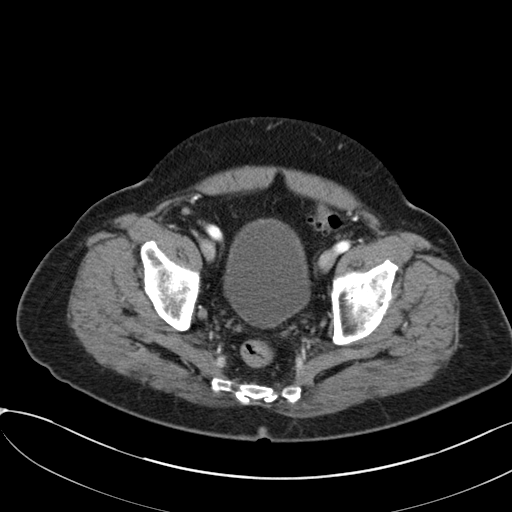
[im 25/85  soft-tissue]
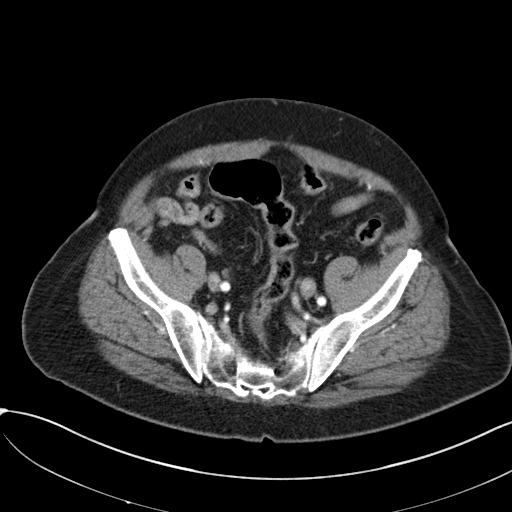
[im 31/85  soft-tissue]
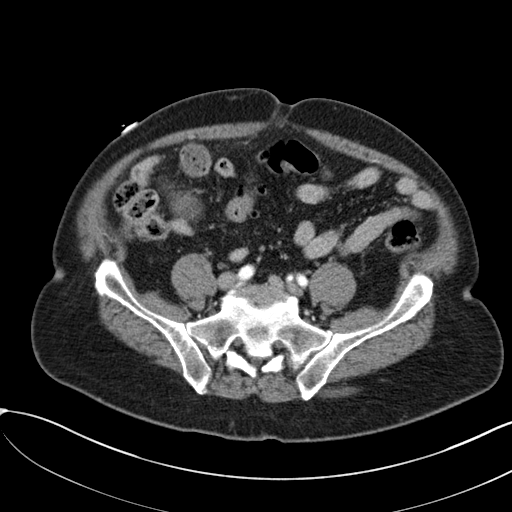
[im 37/85  soft-tissue]
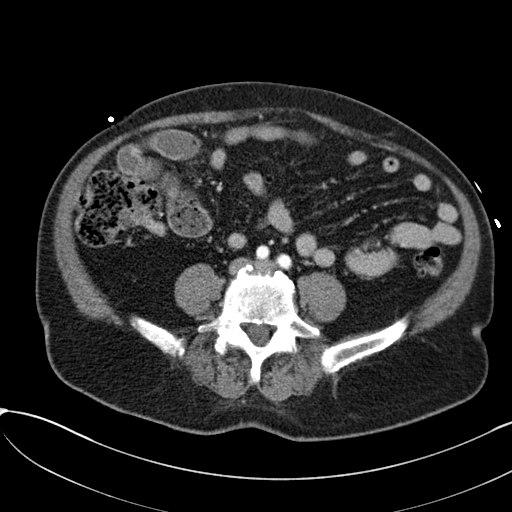
[im 43/85  soft-tissue]
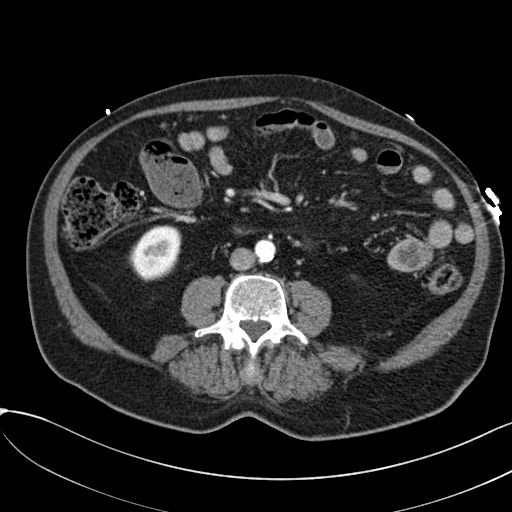
[im 49/85  soft-tissue]
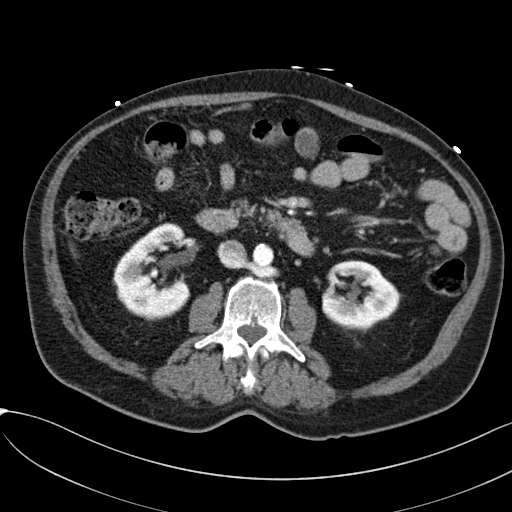
[im 55/85  soft-tissue]
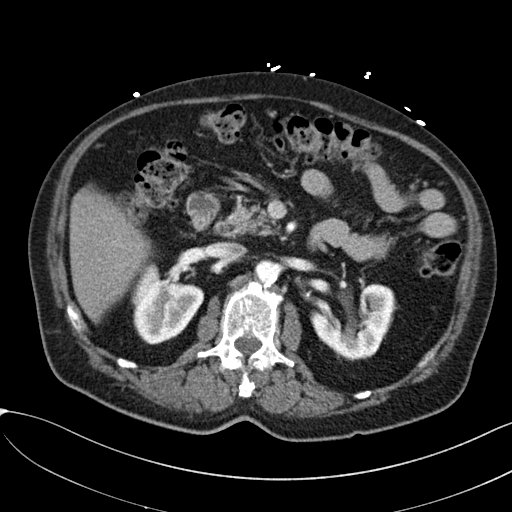
[im 55/85  bone]
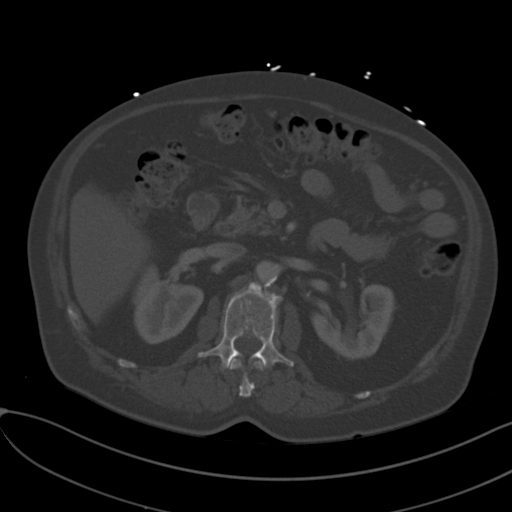
[im 61/85  soft-tissue]
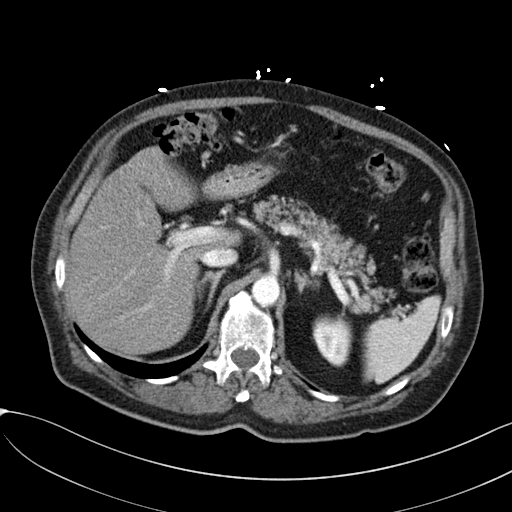
[im 67/85  soft-tissue]
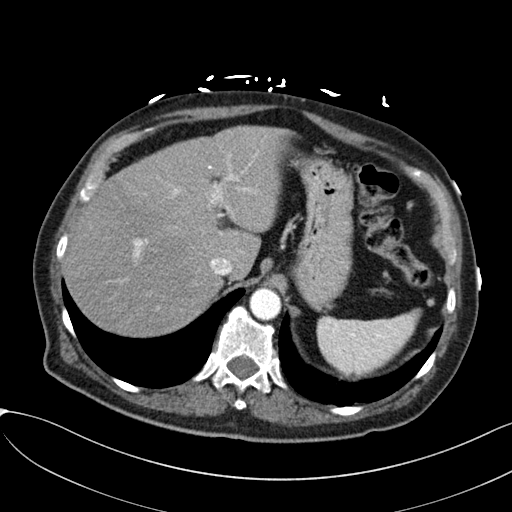
[im 73/85  soft-tissue]
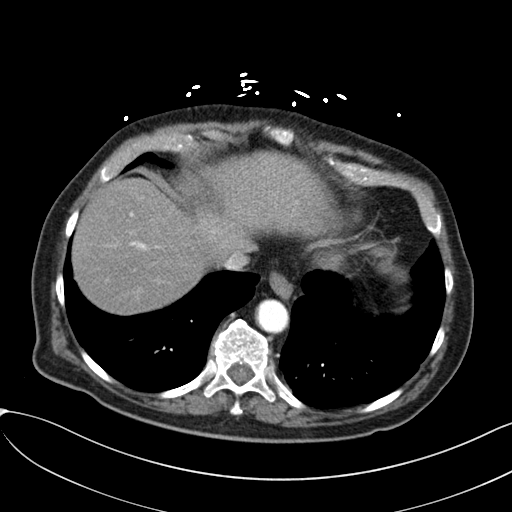
[im 79/85  soft-tissue]
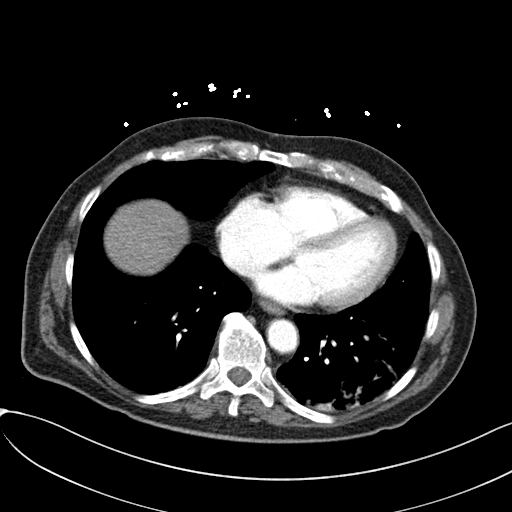

[Series 5: coronal st · coronal · 0.74mm/px · 3 of 141 slices shown]
[im 47/141  soft-tissue]
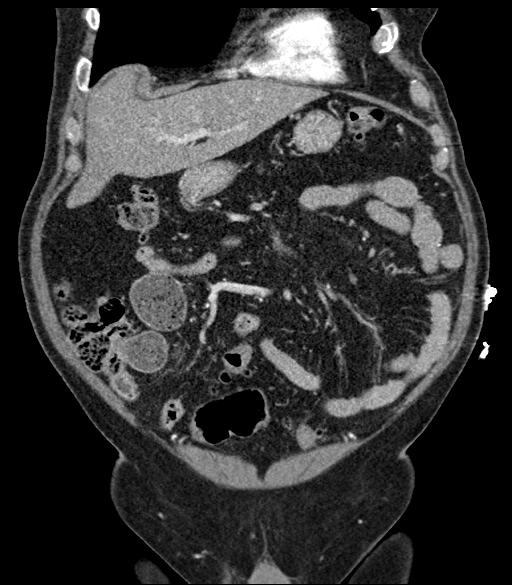
[im 63/141  soft-tissue]
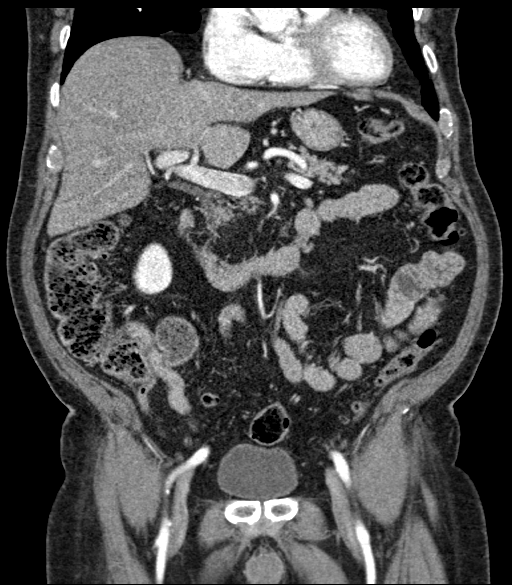
[im 78/141  soft-tissue]
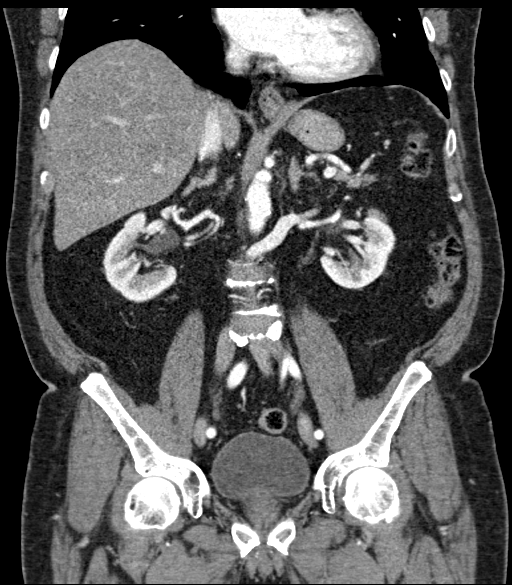

[16 of 46 positions shown; findings below may reference images not displayed]

FINDINGS: Lower chest: Patchy bilateral opacities at the lung bases worse on
the left than the right. No signs of pleural effusion. No dense
consolidation. No pericardial fluid.

Hepatobiliary: Probable background hepatic end of sentence post
cholecystectomy without biliary ductal distension. Steatosis.

Pancreas: Unremarkable. No pancreatic ductal dilatation or
surrounding inflammatory changes.

Spleen: Normal in size without focal abnormality.

Adrenals/Urinary Tract: Normal adrenal glands. Symmetric enhancement
of the bilateral kidneys without signs of hydronephrosis.

Stomach/Bowel: Signs of colonic diverticulosis. No signs of acute
diverticulitis. Normal appendix. Moderate segmental distension of
the mid to distal ileum in the right hemiabdomen with gradual
transition measuring approximately 3.4 cm in greatest caliber, wall
enhancement is preserved. No signs of perienteric stranding.

Vascular/Lymphatic: Scattered calcified atherosclerotic and
noncalcified atherosclerotic plaque throughout the abdominal aorta.
No signs of aneurysm. Retroaortic left renal vein. Patent SMV and
portal vein. No signs of adenopathy in the abdomen or in the pelvis.
It has

Reproductive: Mild prostatomegaly.

Other: No abdominal wall hernia or abnormality. No abdominopelvic
ascites.

Musculoskeletal: Signs of spinal degenerative change, no signs of
acute or destructive bone finding.
IMPRESSION: 1. Moderate segmental distension of the mid to distal ileum in the
right hemiabdomen with gradual transition measuring approximately
3.4 cm in greatest caliber. No signs of perienteric stranding.
Findings are of uncertain significance and may be related to mild
enteritis or focal ileus. Attention on follow-up is suggested given
recent COVID infection and gastrointestinal symptoms. Close
follow-up may be warranted if there are continued GI symptoms.
2. Signs of parenchymal disease in the lungs likely related to
history of COVID infection, based on comparison with previous chest
radiograph likely improved since 06/23/2019.
[DATE]. Hepatic steatosis.
4. Signs of colonic diverticulosis without signs of acute
diverticulitis.
5. Aortic atherosclerosis.

Aortic Atherosclerosis (AE8CC-WYD.D).

## 2020-05-27 ENCOUNTER — Encounter (HOSPITAL_COMMUNITY): Payer: Self-pay

## 2020-05-27 ENCOUNTER — Other Ambulatory Visit: Payer: Self-pay

## 2020-05-27 DIAGNOSIS — I10 Essential (primary) hypertension: Secondary | ICD-10-CM | POA: Diagnosis not present

## 2020-05-27 DIAGNOSIS — Z79899 Other long term (current) drug therapy: Secondary | ICD-10-CM | POA: Insufficient documentation

## 2020-05-27 DIAGNOSIS — Z8616 Personal history of COVID-19: Secondary | ICD-10-CM | POA: Insufficient documentation

## 2020-05-27 DIAGNOSIS — Z7982 Long term (current) use of aspirin: Secondary | ICD-10-CM | POA: Diagnosis not present

## 2020-05-27 DIAGNOSIS — T783XXA Angioneurotic edema, initial encounter: Secondary | ICD-10-CM | POA: Diagnosis not present

## 2020-05-27 DIAGNOSIS — R29810 Facial weakness: Secondary | ICD-10-CM | POA: Diagnosis present

## 2020-05-27 NOTE — ED Triage Notes (Signed)
Pt arrives to ER with c/o possible stroke and left sided facial droop. Pt support person states he had been having lip swelling for a few days and it came back at 2100 tonight. Pt ambulatory. NIH-0.

## 2020-05-28 ENCOUNTER — Emergency Department (HOSPITAL_COMMUNITY)
Admission: EM | Admit: 2020-05-28 | Discharge: 2020-05-28 | Disposition: A | Discharge status: Home / Self Care | Payer: Medicaid Other | Attending: Emergency Medicine | Admitting: Emergency Medicine

## 2020-05-28 DIAGNOSIS — T783XXA Angioneurotic edema, initial encounter: Secondary | ICD-10-CM

## 2020-05-28 MED ORDER — DIPHENHYDRAMINE HCL 25 MG PO TABS: ORAL_TABLET | ORAL | 0 refills | Status: AC

## 2020-05-28 MED ORDER — DIPHENHYDRAMINE HCL 25 MG PO CAPS
25.0000 mg | ORAL_CAPSULE | Freq: Once | ORAL | Status: AC
  Administered 2020-05-28: 25 mg via ORAL
  Filled 2020-05-28: qty 1

## 2020-05-28 MED ORDER — DIPHENHYDRAMINE HCL 50 MG/ML IJ SOLN: 25.0000 mg | Freq: Once | INTRAMUSCULAR | Status: DC

## 2020-05-28 MED ORDER — DEXAMETHASONE SODIUM PHOSPHATE 10 MG/ML IJ SOLN
10.0000 mg | Freq: Once | INTRAMUSCULAR | Status: AC
  Administered 2020-05-28: 10 mg via INTRAMUSCULAR
  Filled 2020-05-28: qty 1

## 2020-05-28 NOTE — ED Provider Notes (Addendum)
[facility]-EMERGENCY DEPT Provider Note: [name], MD, FACEP  CSN: [id number] MRN: [id number] ARRIVAL: 05/27/20 at 2204 ROOM: WA20/WA20   CHIEF COMPLAINT  Facial Droop   HISTORY OF PRESENT ILLNESS  05/28/20 5:42 AM Michael Steele is a 79 y.o. male with no history of angioedema or ACE inhibitor or ARB use.  He is here with several days of left lower lip swelling.  It returned about 9 PM yesterday evening so he became concerned that he was having a stroke because he thought his face was drooping.  He is having no associated pain.  He did not eat anything unusual for dinner yesterday evening.  He is having no difficulty swallowing or breathing.  His tongue and throat are not affected.  He has not taken anything for it.   Past Medical History:  Diagnosis Date  . COVID-19   . GERD (gastroesophageal reflux disease)   . Hyperlipidemia   . Hypertension     History reviewed. No pertinent surgical history.  No family history on file.  Social History   Tobacco Use  . Smoking status: Never Smoker  . Smokeless tobacco: Never Used  Vaping Use  . Vaping Use: Never used  Substance Use Topics  . Alcohol use: Yes    Comment: socially  . Drug use: Never    Prior to Admission medications   Medication Sig Start Date End Date Taking? Authorizing Provider  amLODipine (NORVASC) 2.5 MG tablet Take 2.5 mg by mouth 2 (two) times daily. 06/07/19   [provider]  aspirin EC 81 MG tablet Take 1 tablet (81 mg total) by mouth daily. 06/26/19 06/25/20  Ghimire, Werner Lean, MD  dicyclomine (BENTYL) 20 MG tablet Take 1 tablet (20 mg total) by mouth 2 (two) times daily as needed for spasms. 06/30/19   Alvira Monday, MD  diphenhydrAMINE (BENADRYL) 25 MG tablet Take 1 tablet every 4 hours as needed for lip swelling. 05/28/20   Caliya Narine, MD  fenofibrate (TRICOR) 48 MG tablet Take 48 mg by mouth daily. 06/07/19   [provider]  metFORMIN (GLUCOPHAGE) 500 MG tablet Take 1 tablet (500  mg total) by mouth 2 (two) times daily with a meal. 06/26/19 06/25/20  Ghimire, Werner Lean, MD  omeprazole (PRILOSEC) 20 MG capsule Take 20 mg by mouth daily. 05/25/19   [provider]  cetirizine (ZYRTEC) 5 MG tablet Take 5 mg by mouth daily as needed for allergies.  05/28/20  [provider]    Allergies Patient has no known allergies.   REVIEW OF SYSTEMS  Negative except as noted here or in the History of Present Illness.   PHYSICAL EXAMINATION  Initial Vital Signs Blood pressure 131/83, pulse 100, temperature 98.1 F (36.7 C), temperature source Oral, resp. rate 18, height 5\' 5"  (1.651 m), weight 76 kg, SpO2 98 %.  Examination General: Well-developed, well-nourished male in no acute distress; appearance consistent with age of record HENT: normocephalic; atraumatic; angioedema of lower lip without involvement of tongue or pharynx:    Eyes: pupils equal, round and reactive to light; extraocular muscles intact Neck: supple Heart: regular rate and rhythm Lungs: clear to auscultation bilaterally Abdomen: soft; nondistended; nontender; bowel sounds present Extremities: No deformity; full range of motion; pulses normal; trace edema of lower legs Neurologic: Awake, alert and oriented; motor function intact in all extremities and symmetric; no facial droop Skin: Warm and dry Psychiatric: Normal mood and affect   RESULTS  Summary of this visit's results, reviewed and interpreted  by myself:   EKG Interpretation  Date/Time:    Ventricular Rate:    PR Interval:    QRS Duration:   QT Interval:    QTC Calculation:   R Axis:     Text Interpretation:        Laboratory Studies: No results found for this or any previous visit (from the past 24 hour(s)). Imaging Studies: No results found.  ED COURSE and MDM  Nursing notes, initial and subsequent vitals signs, including pulse oximetry, reviewed and interpreted by myself.  Vitals:   05/27/20 2214 05/27/20 2217   BP: 131/83   Pulse: 100   Resp: 18   Temp: 98.1 F (36.7 C)   TempSrc: Oral   SpO2: 98%   Weight:  76 kg  Height:  5\' 5"  (1.651 m)   Medications  dexamethasone (DECADRON) injection 10 mg (has no administration in time range)  diphenhydrAMINE (BENADRYL) capsule 25 mg (has no administration in time range)    Presentation consistent with angioedema, likely allergic as patient is not on an ACE inhibitor or ARB.  We will treat with a dose of dexamethasone and Benadryl as needed.  PROCEDURES  Procedures   ED DIAGNOSES     ICD-10-CM   1. Angioedema of lips, initial encounter  T78. , MD 05/28/20 13/03/21    2536, MD 05/28/20 [phone]

## 2020-05-28 NOTE — ED Notes (Signed)
Pt c/o swelling to left lower lip x 2 days.  Pt denies trauma . No resp. Distress noted

## 2020-06-03 ENCOUNTER — Other Ambulatory Visit: Payer: Medicaid Other

## 2020-06-03 DIAGNOSIS — Z20822 Contact with and (suspected) exposure to covid-19: Secondary | ICD-10-CM

## 2020-06-04 LAB — SARS-COV-2, NAA 2 DAY TAT

## 2020-06-04 LAB — NOVEL CORONAVIRUS, NAA: SARS-CoV-2, NAA: NOT DETECTED

## 2023-08-14 NOTE — Progress Notes (Unsigned)
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         History of Present Illness  08/16/2023  Pulmonary/ 1st office eval/Juwana Thoreson  Chief Complaint  Patient presents with   Consult    SOB x several years.   Dyspnea:  does fine walking flat eg [employer] / fatigue seems to be more impt than doe per se  Cough: no  cough  Sleep: one thin pillow better p sleep aid  SABA use: none  02 use:  has machine since 2020 not really using with exertion.     No obvious day to day or daytime pattern/variability or assoc excess/ purulent sputum or mucus plugs or hemoptysis or cp or chest tightness, subjective wheeze or overt sinus or hb symptoms.    Also denies any obvious fluctuation of symptoms with weather or environmental changes or other aggravating or alleviating factors except as outlined above   No unusual exposure hx or h/o childhood pna/ asthma or knowledge of premature birth.  Current Allergies, Complete Past Medical History, Past Surgical History, Family History, and Social History were reviewed in [employer] record.  ROS  The following are not active complaints unless bolded Hoarseness, sore throat, dysphagia, dental problems, itching, sneezing,  nasal congestion or discharge of excess mucus or purulent secretions, ear ache,   fever, chills, sweats, unintended wt loss or wt gain, classically pleuritic or exertional cp,  orthopnea pnd or arm/hand swelling  or leg swelling, presyncope, palpitations, abdominal pain, anorexia, nausea, vomiting, diarrhea  or change in bowel habits or change in bladder habits, change in stools or change in  urine, dysuria, hematuria,  rash, arthralgias, visual complaints, headache, numbness, weakness or ataxia or problems with walking or coordination,  change in mood or  memory.             Outpatient Medications Prior to Visit  Medication Sig Dispense Refill   amLODipine (NORVASC) 2.5 MG tablet Take 2.5 mg by mouth 2 (two) times daily.     aspirin EC 81 MG tablet Take 81 mg by mouth daily. Swallow whole.     dicyclomine (BENTYL) 20 MG tablet Take 1 tablet (20 mg total) by mouth 2 (two) times daily as needed for spasms. 20 tablet 0   diphenhydrAMINE (BENADRYL) 25 MG tablet Take 1 tablet every 4 hours as needed for lip swelling. 24 tablet 0   Doxylamine Succinate, Sleep, (SLEEP AID PO) Take 1 tablet by mouth at bedtime.     fenofibrate (TRICOR) 48 MG tablet Take 48 mg by mouth daily.     gabapentin (NEURONTIN) 300 MG capsule Take 300 mg by mouth 2 (two) times daily.     meloxicam (MOBIC) 15 MG tablet Take 15 mg by mouth at bedtime.     metFORMIN (GLUCOPHAGE) 500 MG tablet Take 1 tablet (500 mg total) by mouth 2 (two) times daily with a meal. 60 tablet 0   metoprolol tartrate (LOPRESSOR) 25 MG tablet Take 25 mg by mouth in the morning.     omeprazole (PRILOSEC) 20 MG capsule Take 20  mg by mouth daily.     rosuvastatin (CRESTOR) 20 MG tablet Take 20 mg by mouth daily.     No facility-administered medications prior to visit.    Past Medical History:  Diagnosis Date   COVID-19    GERD (gastroesophageal reflux disease)    Hyperlipidemia    Hypertension       Objective:     BP 120/66 (BP Location: Left Arm, Patient Position: Sitting, Cuff Size: Normal)   Pulse 78   Temp 97.9 F (36.6 C) (Oral)   Ht 5\' 5"  (1.651 m)   Wt 163 lb 12.8 oz (74.3 kg)   SpO2 99%   BMI 27.26 kg/m   SpO2: 99 %  RA  Somber amb [place] male nad    HEENT : Oropharynx  clear        NECK :  without  apparent JVD/ palpable Nodes/TM    LUNGS: no acc muscle use,  Nl contour chest which is clear to A and P  bilaterally without cough on insp or exp maneuvers   CV:  RRR  no s3 or murmur or increase in P2, and no edema   ABD:  soft and nontender   MS:  Gait nl   ext warm without deformities Or obvious joint restrictions  calf tenderness, cyanosis or clubbing    SKIN: warm and dry without lesions    NEURO:  alert, approp, nl sensorium with  no motor or cerebellar deficits apparent.      CXR PA and Lateral:   08/16/2023 :    I personally reviewed images and impression is as follows:     No acute findings    Labs ordered/ reviewed:      Chemistry      Component Value Date/Time   NA 140 08/16/2023 1640   K 4.3 08/16/2023 1640   CL 107 08/16/2023 1640   CO2 25 08/16/2023 1640   BUN 18 08/16/2023 1640   CREATININE 0.89 08/16/2023 1640      Component Value Date/Time   CALCIUM 9.1 08/16/2023 1640   ALKPHOS 53 06/30/2019 1040   AST 29 06/30/2019 1040   ALT 59 (H) 06/30/2019 1040   BILITOT 1.0 06/30/2019 1040        Lab Results  Component Value Date   WBC 11.0 (H) 08/16/2023   HGB 14.6 08/16/2023   HCT 44.6 08/16/2023   MCV 85.3 08/16/2023   PLT 209.0 08/16/2023     Lab Results  Component Value Date   DDIMER 0.54 (H) 08/16/2023      Lab Results  Component Value Date   TSH 3.14 08/16/2023     Lab Results  Component Value Date   PROBNP 41.0 08/16/2023       Lab Results  Component Value Date   ESRSEDRATE 30 (H) 08/16/2023        Assessment   No problem-specific Assessment & Plan notes found for this encounter.     Sandrea Hughs, MD 08/16/2023

## 2023-08-16 ENCOUNTER — Encounter: Payer: Self-pay | Admitting: Internal Medicine

## 2023-08-16 ENCOUNTER — Ambulatory Visit: Payer: Medicaid Other | Admitting: Internal Medicine

## 2023-08-16 ENCOUNTER — Ambulatory Visit: Payer: Medicaid Other

## 2023-08-16 VITALS — BP 120/66 | HR 78 | Temp 97.9°F | Ht 65.0 in | Wt 163.8 lb

## 2023-08-16 DIAGNOSIS — R0609 Other forms of dyspnea: Secondary | ICD-10-CM

## 2023-08-16 NOTE — Patient Instructions (Signed)
To get the most out of exercise, you need to be continuously aware that you are short of breath, but never out of breath, for at least 30 minutes daily. As you improve, it will actually be easier for you to do the same amount of exercise  in  30 minutes so always push to the level where you are short of breath.    Make sure you check your oxygen saturations at highest level of activity   Please remember to go to the lab department   for your tests - we will call you with the results when they are available.      Please remember to go to the  x-ray department  for your tests - we will call you with the results when they are available     Please schedule a follow up office visit in 6 weeks, call sooner if needed with all medications /inhalers/ solutions in hand so we can verify exactly what you are taking. This includes all medications from all doctors and over the counters

## 2023-08-17 LAB — CBC WITH DIFFERENTIAL/PLATELET
Basophils Absolute: 0.1 10*3/uL (ref 0.0–0.1)
Basophils Relative: 0.5 % (ref 0.0–3.0)
Eosinophils Absolute: 0.3 10*3/uL (ref 0.0–0.7)
Eosinophils Relative: 2.4 % (ref 0.0–5.0)
HCT: 44.6 % (ref 39.0–52.0)
Hemoglobin: 14.6 g/dL (ref 13.0–17.0)
Lymphocytes Relative: 25.1 % (ref 12.0–46.0)
Lymphs Abs: 2.8 10*3/uL (ref 0.7–4.0)
MCHC: 32.7 g/dL (ref 30.0–36.0)
MCV: 85.3 fL (ref 78.0–100.0)
Monocytes Absolute: 1.2 10*3/uL — ABNORMAL HIGH (ref 0.1–1.0)
Monocytes Relative: 10.5 % (ref 3.0–12.0)
Neutro Abs: 6.7 10*3/uL (ref 1.4–7.7)
Neutrophils Relative %: 61.5 % (ref 43.0–77.0)
Platelets: 209 10*3/uL (ref 150.0–400.0)
RBC: 5.22 Mil/uL (ref 4.22–5.81)
RDW: 14 % (ref 11.5–15.5)
WBC: 11 10*3/uL — ABNORMAL HIGH (ref 4.0–10.5)

## 2023-08-17 LAB — TSH: TSH: 3.14 u[IU]/mL (ref 0.35–5.50)

## 2023-08-17 LAB — SEDIMENTATION RATE: Sed Rate: 30 mm/h — ABNORMAL HIGH (ref 0–20)

## 2023-08-17 LAB — BASIC METABOLIC PANEL
BUN: 18 mg/dL (ref 6–23)
CO2: 25 meq/L (ref 19–32)
Calcium: 9.1 mg/dL (ref 8.4–10.5)
Chloride: 107 meq/L (ref 96–112)
Creatinine, Ser: 0.89 mg/dL (ref 0.40–1.50)
GFR: 80.76 mL/min (ref >60.00)
Glucose, Bld: 81 mg/dL (ref 70–99)
Potassium: 4.3 meq/L (ref 3.5–5.1)
Sodium: 140 meq/L (ref 135–145)

## 2023-08-17 LAB — D-DIMER, QUANTITATIVE: D-Dimer, Quant: 0.54 ug{FEU}/mL — ABNORMAL HIGH (ref <0.50)

## 2023-08-17 LAB — BRAIN NATRIURETIC PEPTIDE: Pro B Natriuretic peptide (BNP): 41 pg/mL (ref 0.0–100.0)

## 2023-08-17 NOTE — Assessment & Plan Note (Addendum)
DOE  x at least 2018 with neg cards w/u then and much worse x 2020 covid    -  08/16/2023   Walked on RA  x  3  lap(s) =  approx 750  ft  @ brisk pace, stopped due to end of study  with lowest 02 sats 98% but mild sob on last lap   Symptoms are markedly disproportionate to objective findings and not clear to what extent this is actually a pulmonary  problem but pt does appear to have difficult to sort out respiratory symptoms of unknown origin for which  DDX  = almost all start with A and  include Adherence, Ace Inhibitors, Acid Reflux, Active Sinus Disease, Alpha 1 Antitripsin deficiency, Anxiety masquerading as Airways dz,  ABPA,  Allergy(esp in young), Aspiration (esp in elderly), Adverse effects of meds,  Active smoking or Vaping, A bunch of PE's/clot burden (a few small clots can't cause this syndrome unless there is already severe underlying pulm or vascular dz with poor reserve),  Anemia or thyroid disorder, plus two Bs  = Bronchiectasis and Beta blocker use..and one C= CHF     Bolded dx's most likely and all excluded by labs x for :  ? Anxiety/depression/ deconditioning  > usually at the bottom of this list of usual suspects but should be much higher on this pt's based on H and P and   and may interfere with adherence and also interpretation of response or lack thereof to symptom management which can be quite subjective.   Rec: Reconditioning x at least 6 weeks then consider cpst if available at that point (not operational at present)   Pulmonary f/u can be prn   Each maintenance medication was reviewed in detail including emphasizing most importantly the difference between maintenance and prns and under what circumstances the prns are to be triggered using an action plan format where appropriate.  Total time for H and P, chart review, counseling,  directly observing portions of ambulatory 02 saturation study/ and generating customized AVS unique to this office visit / same day charting  > 45  min for new pt with   refractory respiratory  symptoms of uncertain etiology.

## 2024-01-15 ENCOUNTER — Encounter (HOSPITAL_COMMUNITY): Payer: Self-pay

## 2024-01-15 ENCOUNTER — Emergency Department (HOSPITAL_COMMUNITY)

## 2024-01-15 ENCOUNTER — Other Ambulatory Visit: Payer: Self-pay

## 2024-01-15 ENCOUNTER — Inpatient Hospital Stay (HOSPITAL_COMMUNITY)
Admission: EM | Admit: 2024-01-15 | Discharge: 2024-01-18 | DRG: 379 | Disposition: A | Discharge status: Home / Self Care | Attending: Family Medicine | Admitting: Family Medicine

## 2024-01-15 DIAGNOSIS — I10 Essential (primary) hypertension: Secondary | ICD-10-CM | POA: Diagnosis present

## 2024-01-15 DIAGNOSIS — K259 Gastric ulcer, unspecified as acute or chronic, without hemorrhage or perforation: Secondary | ICD-10-CM | POA: Diagnosis present

## 2024-01-15 DIAGNOSIS — I4891 Unspecified atrial fibrillation: Secondary | ICD-10-CM | POA: Diagnosis not present

## 2024-01-15 DIAGNOSIS — Z7984 Long term (current) use of oral hypoglycemic drugs: Secondary | ICD-10-CM | POA: Diagnosis not present

## 2024-01-15 DIAGNOSIS — Z7982 Long term (current) use of aspirin: Secondary | ICD-10-CM | POA: Diagnosis not present

## 2024-01-15 DIAGNOSIS — K222 Esophageal obstruction: Secondary | ICD-10-CM | POA: Diagnosis present

## 2024-01-15 DIAGNOSIS — K254 Chronic or unspecified gastric ulcer with hemorrhage: Principal | ICD-10-CM | POA: Diagnosis present

## 2024-01-15 DIAGNOSIS — Z791 Long term (current) use of non-steroidal anti-inflammatories (NSAID): Secondary | ICD-10-CM | POA: Diagnosis not present

## 2024-01-15 DIAGNOSIS — K922 Gastrointestinal hemorrhage, unspecified: Principal | ICD-10-CM

## 2024-01-15 DIAGNOSIS — K253 Acute gastric ulcer without hemorrhage or perforation: Secondary | ICD-10-CM | POA: Diagnosis not present

## 2024-01-15 DIAGNOSIS — K269 Duodenal ulcer, unspecified as acute or chronic, without hemorrhage or perforation: Secondary | ICD-10-CM | POA: Diagnosis not present

## 2024-01-15 DIAGNOSIS — K264 Chronic or unspecified duodenal ulcer with hemorrhage: Secondary | ICD-10-CM | POA: Diagnosis present

## 2024-01-15 DIAGNOSIS — K921 Melena: Secondary | ICD-10-CM | POA: Diagnosis not present

## 2024-01-15 DIAGNOSIS — K219 Gastro-esophageal reflux disease without esophagitis: Secondary | ICD-10-CM | POA: Diagnosis present

## 2024-01-15 DIAGNOSIS — E785 Hyperlipidemia, unspecified: Secondary | ICD-10-CM | POA: Diagnosis present

## 2024-01-15 HISTORY — DX: Unspecified atrial fibrillation: I48.91

## 2024-01-15 LAB — URINALYSIS, ROUTINE W REFLEX MICROSCOPIC
Bilirubin Urine: NEGATIVE
Glucose, UA: NEGATIVE mg/dL
Hgb urine dipstick: NEGATIVE
Ketones, ur: NEGATIVE mg/dL
Leukocytes,Ua: NEGATIVE
Nitrite: NEGATIVE
Protein, ur: NEGATIVE mg/dL
Specific Gravity, Urine: 1.008 (ref 1.005–1.030)
pH: 6 (ref 5.0–8.0)

## 2024-01-15 LAB — COMPREHENSIVE METABOLIC PANEL WITH GFR
ALT: 17 U/L (ref 0–44)
AST: 22 U/L (ref 15–41)
Albumin: 3.4 g/dL — ABNORMAL LOW (ref 3.5–5.0)
Alkaline Phosphatase: 58 U/L (ref 38–126)
Anion gap: 8 (ref 5–15)
BUN: 14 mg/dL (ref 8–23)
CO2: 23 mmol/L (ref 22–32)
Calcium: 8.6 mg/dL — ABNORMAL LOW (ref 8.9–10.3)
Chloride: 105 mmol/L (ref 98–111)
Creatinine, Ser: 0.86 mg/dL (ref 0.61–1.24)
GFR, Estimated: >60 mL/min (ref >60)
Glucose, Bld: 123 mg/dL — ABNORMAL HIGH (ref 70–99)
Potassium: 3.9 mmol/L (ref 3.5–5.1)
Sodium: 136 mmol/L (ref 135–145)
Total Bilirubin: 0.6 mg/dL (ref 0.0–1.2)
Total Protein: 6.9 g/dL (ref 6.5–8.1)

## 2024-01-15 LAB — CBC
HCT: 36.1 % — ABNORMAL LOW (ref 39.0–52.0)
Hemoglobin: 11.8 g/dL — ABNORMAL LOW (ref 13.0–17.0)
MCH: 27.5 pg (ref 26.0–34.0)
MCHC: 32.7 g/dL (ref 30.0–36.0)
MCV: 84.1 fL (ref 80.0–100.0)
Platelets: 294 10*3/uL (ref 150–400)
RBC: 4.29 MIL/uL (ref 4.22–5.81)
RDW: 14.9 % (ref 11.5–15.5)
WBC: 11.6 10*3/uL — ABNORMAL HIGH (ref 4.0–10.5)
nRBC: 0 % (ref 0.0–0.2)

## 2024-01-15 LAB — HEMOGLOBIN: Hemoglobin: 11.5 g/dL — ABNORMAL LOW (ref 13.0–17.0)

## 2024-01-15 LAB — POC OCCULT BLOOD, ED: Fecal Occult Bld: POSITIVE — AB

## 2024-01-15 LAB — TYPE AND SCREEN
ABO/RH(D): B POS
Antibody Screen: NEGATIVE

## 2024-01-15 LAB — GLUCOSE, CAPILLARY
Glucose-Capillary: 121 mg/dL — ABNORMAL HIGH (ref 70–99)
Glucose-Capillary: 98 mg/dL (ref 70–99)

## 2024-01-15 LAB — LIPASE, BLOOD: Lipase: 35 U/L (ref 11–51)

## 2024-01-15 MED ORDER — ACETAMINOPHEN 650 MG RE SUPP: 650.0000 mg | Freq: Four times a day (QID) | RECTAL | Status: DC | PRN

## 2024-01-15 MED ORDER — ALBUTEROL SULFATE (2.5 MG/3ML) 0.083% IN NEBU
2.5000 mg | INHALATION_SOLUTION | RESPIRATORY_TRACT | Status: DC | PRN
Start: 2024-01-15 — End: 2024-01-18

## 2024-01-15 MED ORDER — IOHEXOL 300 MG/ML  SOLN
100.0000 mL | Freq: Once | INTRAMUSCULAR | Status: AC | PRN
  Administered 2024-01-15: 100 mL via INTRAVENOUS

## 2024-01-15 MED ORDER — MORPHINE SULFATE (PF) 4 MG/ML IV SOLN
4.0000 mg | Freq: Once | INTRAVENOUS | Status: AC
  Administered 2024-01-15: 4 mg via INTRAVENOUS
  Filled 2024-01-15: qty 1

## 2024-01-15 MED ORDER — MELATONIN 5 MG PO TABS
5.0000 mg | ORAL_TABLET | Freq: Once | ORAL | Status: AC
  Administered 2024-01-16: 5 mg via ORAL
  Filled 2024-01-15: qty 1

## 2024-01-15 MED ORDER — PANTOPRAZOLE SODIUM 40 MG IV SOLR
40.0000 mg | Freq: Once | INTRAVENOUS | Status: AC
  Administered 2024-01-15: 40 mg via INTRAVENOUS
  Filled 2024-01-15: qty 10

## 2024-01-15 MED ORDER — ACETAMINOPHEN 325 MG PO TABS: 650.0000 mg | ORAL_TABLET | Freq: Four times a day (QID) | ORAL | Status: DC | PRN

## 2024-01-15 MED ORDER — INSULIN ASPART 100 UNIT/ML IJ SOLN
0.0000 [IU] | INTRAMUSCULAR | Status: DC
  Filled 2024-01-15: qty 0.06

## 2024-01-15 MED ORDER — ONDANSETRON HCL 4 MG PO TABS: 4.0000 mg | ORAL_TABLET | Freq: Four times a day (QID) | ORAL | Status: DC | PRN

## 2024-01-15 MED ORDER — SODIUM CHLORIDE 0.9 % IV SOLN
INTRAVENOUS | Status: DC
Start: 2024-01-15 — End: 2024-01-16

## 2024-01-15 MED ORDER — ONDANSETRON HCL 4 MG/2ML IJ SOLN
4.0000 mg | Freq: Once | INTRAMUSCULAR | Status: AC
  Administered 2024-01-15: 4 mg via INTRAVENOUS
  Filled 2024-01-15: qty 2

## 2024-01-15 MED ORDER — ONDANSETRON HCL 4 MG/2ML IJ SOLN
4.0000 mg | Freq: Four times a day (QID) | INTRAMUSCULAR | Status: DC | PRN
Start: 2024-01-15 — End: 2024-01-18

## 2024-01-15 NOTE — ED Notes (Signed)
 Pt gone to CT and will give meds when he returns

## 2024-01-15 NOTE — H&P (Incomplete)
 History and Physical    Michael Steele FMW:[id number] DOB: 12-26-1940 DOA: 01/15/2024  PCP: [facility] (Inactive)  Patient coming from: home I have personally briefly reviewed patient's old medical records in [facility]  Chief Complaint: abdominal pain x 2 weeks worse x 1 days associated with black stools.  HPI: Michael Steele is a 83 y.o. male with medical history significant of  GERD, HLD, HTN  who presents to ED with 2 weeks of upper abdominal pain which has gotten worse over the last 24 hours. Patient states he has also noted black stools x 2 weeks.  He notes no associated sob/n/v/d or cardiac chest pain.  He notes bleeding from the colon around 5 years ago but states c scope was negative at that time. Patient of note does has history of nsaid use.  ED Course:  Vitals: afeb, BP 132/69, RR19 , HR 75   NSR Low voltage Wbc 11.6, hgb 11.8,plt 294 Lipase 35 Na 136, K 3.9, CL 105, bicarb 23, Glu 123, cr 0.86, Ast 22, ALT 17 Fob + UA-neg IN ED patient was noted to have hgb of 11.2 down from around 14 few months ago.  CTAB/pelvis IMPRESSION: 1. Bowel wall thickening involving the gastric antrum and proximal duodenum with surrounding fat stranding, suggesting gastritis/duodenitis. 2. Diverticulosis without diverticulitis. 3. Hepatic steatosis. 4. Aortic atherosclerosis.  Tx protonix  injection 40mg , zofran  ,morphine   Review of Systems: As per HPI otherwise 10 point review of systems negative.   Past Medical History:  Diagnosis Date   COVID-19    GERD (gastroesophageal reflux disease)    Hyperlipidemia    Hypertension     History reviewed. No pertinent surgical history.   reports that he has never smoked. He has never used smokeless tobacco. He reports current alcohol use. He reports that he does not use drugs.  No Known Allergies  History reviewed. No pertinent family history.  Prior to Admission medications   Medication Sig Start  Date End Date Taking? Authorizing Provider  amLODipine  (NORVASC ) 2.5 MG tablet Take 2.5 mg by mouth 2 (two) times daily. 06/07/19   [provider]  aspirin  EC 81 MG tablet Take 81 mg by mouth daily. Swallow whole.    [provider]  dicyclomine  (BENTYL ) 20 MG tablet Take 1 tablet (20 mg total) by mouth 2 (two) times daily as needed for spasms. 06/30/19   Dreama Longs, MD  diphenhydrAMINE  (BENADRYL ) 25 MG tablet Take 1 tablet every 4 hours as needed for lip swelling. 05/28/20   Molpus, John, MD  Doxylamine Succinate, Sleep, (SLEEP AID PO) Take 1 tablet by mouth at bedtime.    [provider]  fenofibrate  (TRICOR ) 48 MG tablet Take 48 mg by mouth daily. 06/07/19   [provider]  gabapentin (NEURONTIN) 300 MG capsule Take 300 mg by mouth 2 (two) times daily.    [provider]  meloxicam (MOBIC) 15 MG tablet Take 15 mg by mouth at bedtime.    [provider]  metFORMIN  (GLUCOPHAGE ) 500 MG tablet Take 1 tablet (500 mg total) by mouth 2 (two) times daily with a meal. 06/26/19 08/16/23  Ghimire, Donalda HERO, MD  metoprolol  tartrate (LOPRESSOR ) 25 MG tablet Take 25 mg by mouth in the morning.    [provider]  omeprazole (PRILOSEC) 20 MG capsule Take 20 mg by mouth daily. 05/25/19   [provider]  rosuvastatin (CRESTOR) 20 MG tablet Take 20 mg by mouth daily.  [provider]  cetirizine (ZYRTEC) 5 MG tablet Take 5 mg by mouth daily as needed for allergies.  05/28/20  [provider]    Physical Exam: Vitals:   01/15/24 1656 01/15/24 1948  BP: 132/69 135/81  Pulse: 75 73  Resp: 18 (!) 22  Temp: 97.8 F (36.6 C)   TempSrc: Oral   SpO2: 100% 98%    Constitutional: NAD, calm, comfortable Vitals:   01/15/24 1656 01/15/24 1948  BP: 132/69 135/81  Pulse: 75 73  Resp: 18 (!) 22  Temp: 97.8 F (36.6 C)   TempSrc: Oral   SpO2: 100% 98%   Eyes: PERRL, lids and conjunctivae normal ENMT: Mucous  membranes are moist. Posterior pharynx clear of any exudate or lesions.Normal dentition.  Neck: normal, supple, no masses, no thyromegaly Respiratory: clear to auscultation bilaterally, no wheezing, no crackles. Normal respiratory effort. No accessory muscle use.  Cardiovascular: Regular rate and rhythm, no murmurs / rubs / gallops. No extremity edema. 2+ pedal pulses. No carotid bruits.  Abdomen: no tenderness, no masses palpated. No hepatosplenomegaly. Bowel sounds positive.  Musculoskeletal: no clubbing / cyanosis. No joint deformity upper and lower extremities. Good ROM, no contractures. Normal muscle tone.  Skin: no rashes, lesions, ulcers. No induration Neurologic: CN 2-12 grossly intact. Sensation intact, DTR normal. Strength 5/5 in all 4.  Psychiatric: Normal judgment and insight. Alert and oriented x 3. Normal mood.    Labs on Admission: I have personally reviewed following labs and imaging studies  CBC: Recent Labs  Lab 01/15/24 1711  WBC 11.6*  HGB 11.8*  HCT 36.1*  MCV 84.1  PLT 294   Basic Metabolic Panel: Recent Labs  Lab 01/15/24 1711  NA 136  K 3.9  CL 105  CO2 23  GLUCOSE 123*  BUN 14  CREATININE 0.86  CALCIUM 8.6*   GFR: CrCl cannot be calculated (Unknown ideal weight.). Liver Function Tests: Recent Labs  Lab 01/15/24 1711  AST 22  ALT 17  ALKPHOS 58  BILITOT 0.6  PROT 6.9  ALBUMIN 3.4*   Recent Labs  Lab 01/15/24 1711  LIPASE 35   No results for input(s): AMMONIA in the last 168 hours. Coagulation Profile: No results for input(s): INR, PROTIME in the last 168 hours. Cardiac Enzymes: No results for input(s): CKTOTAL, CKMB, CKMBINDEX, TROPONINI in the last 168 hours. BNP (last 3 results) Recent Labs    08/16/23 1640  PROBNP 41.0   HbA1C: No results for input(s): HGBA1C in the last 72 hours. CBG: No results for input(s): GLUCAP in the last 168 hours. Lipid Profile: No results for input(s): CHOL, HDL,  LDLCALC, TRIG, CHOLHDL, LDLDIRECT in the last 72 hours. Thyroid  Function Tests: No results for input(s): TSH, T4TOTAL, FREET4, T3FREE, THYROIDAB in the last 72 hours. Anemia Panel: No results for input(s): VITAMINB12, FOLATE, FERRITIN, TIBC, IRON, RETICCTPCT in the last 72 hours. Urine analysis:    Component Value Date/Time   COLORURINE STRAW (A) 01/15/2024 1800   APPEARANCEUR CLEAR 01/15/2024 1800   LABSPEC 1.008 01/15/2024 1800   PHURINE 6.0 01/15/2024 1800   GLUCOSEU NEGATIVE 01/15/2024 1800   HGBUR NEGATIVE 01/15/2024 1800   BILIRUBINUR NEGATIVE 01/15/2024 1800   KETONESUR NEGATIVE 01/15/2024 1800   PROTEINUR NEGATIVE 01/15/2024 1800   NITRITE NEGATIVE 01/15/2024 1800   LEUKOCYTESUR NEGATIVE 01/15/2024 1800    Radiological Exams on Admission: CT ABDOMEN PELVIS W CONTRAST Result Date: 01/15/2024 CLINICAL DATA:  Abdominal pain and dark stools. EXAM: CT ABDOMEN AND PELVIS WITH CONTRAST TECHNIQUE: Multidetector  CT imaging of the abdomen and pelvis was performed using the standard protocol following bolus administration of intravenous contrast. RADIATION DOSE REDUCTION: This exam was performed according to the departmental dose-optimization program which includes automated exposure control, adjustment of the mA and/or kV according to patient size and/or use of iterative reconstruction technique. CONTRAST:  OMNIPAQUE  IOHEXOL  300 MG/ML  SOLN COMPARISON:  06/30/2019. FINDINGS: Lower chest: No acute abnormality. Hepatobiliary: No focal liver abnormality is seen. Fatty infiltration of the liver is noted. Status post cholecystectomy. No biliary dilatation. Pancreas: Unremarkable. No pancreatic ductal dilatation or surrounding inflammatory changes. Spleen: Normal in size without focal abnormality. Adrenals/Urinary Tract: The adrenal glands are within normal limits. The kidneys enhance symmetrically. No renal or ureteral calculus bilaterally. Extrarenal pelvises are  noted bilaterally without evidence of hydronephrosis. The bladder is unremarkable. Stomach/Bowel: There is thickening of the walls of the gastric antrum/duodenal bulb with surrounding fat stranding. No bowel obstruction, free air, or pneumatosis is seen. A normal appendix is identified in the right lower quadrant. Scattered diverticula are present along the colon without evidence of diverticulitis. Vascular/Lymphatic: Aortic atherosclerosis. No enlarged abdominal or pelvic lymph nodes. 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 The reservoir for a penile implant is noted in the low anterior pelvis. Other: No abdominopelvic ascites. A small fat containing umbilical hernia is noted. Musculoskeletal: Degenerative changes are present in the thoracolumbar spine. No acute osseous abnormality. IMPRESSION: 1. Bowel wall thickening involving the gastric antrum and proximal duodenum with surrounding fat stranding, suggesting gastritis/duodenitis. 2. Diverticulosis without diverticulitis. 3. Hepatic steatosis. 4. Aortic atherosclerosis. Electronically Signed   By: Leita Birmingham M.D.   On: 01/15/2024 19:03    EKG: Independently reviewed.  Assessment/Plan   Upper GI bleed  - in the setting of NSAID use  -clears now , npo at midnight - ivfs  -ppi iv bid  - monitor h/h   GERD -ppi  HLD -resume in am   HTN -stable continue on amlodipine  and metoprolol  as bp tolerates   DVT prophylaxis: SCD Code Status: full/ as discussed per patient wishes in event of cardiac arrest  Family Communication: none at bedside Disposition Plan: patient  expected to be admitted greater than 2 midnights  Consults called: Margarete Berke  DR Alejandra Admission status: progressive   Camila DELENA Ned MD [facility]   If 7PM-7AM, please contact night-coverage [email or web address] Password [email or web address]  01/15/2024, 8:34 PM

## 2024-01-15 NOTE — ED Triage Notes (Signed)
 Pt c/o abdominal pain for the past two weeks but yesterday and today the pain is going up into his chest. Pt states his stools have been darker, but not blood. Family states it is distended.

## 2024-01-15 NOTE — ED Provider Notes (Signed)
 [place] EMERGENCY DEPARTMENT AT [facility] Provider Note   CSN: [id number] Arrival date & time: 01/15/24  1649     Patient presents with: Abdominal Pain   Michael Steele is a 83 y.o. male.    Abdominal Pain    Patient has a history of hypertension acid reflux hyperlipidemia.  Patient states he has been having trouble with abdominal pain on and off for the last couple of weeks.  Patient states he has been having a lot of burping.  Yesterday he noted pain moving up towards his chest from his abdomen.  He has also noticed darker stools but no bright red blood.  No vomiting.  No diarrhea.  No dysuria.  Patient has had prior gallbladder surgery.  Today he is not having any pain but yesterday it was very severe so he came in today to be evaluated  Prior to Admission medications   Medication Sig Start Date End Date Taking? Authorizing Provider  amLODipine  (NORVASC ) 2.5 MG tablet Take 2.5 mg by mouth 2 (two) times daily. 06/07/19   [provider]  aspirin  EC 81 MG tablet Take 81 mg by mouth daily. Swallow whole.    [provider]  dicyclomine  (BENTYL ) 20 MG tablet Take 1 tablet (20 mg total) by mouth 2 (two) times daily as needed for spasms. 06/30/19   Dreama Longs, MD  diphenhydrAMINE  (BENADRYL ) 25 MG tablet Take 1 tablet every 4 hours as needed for lip swelling. 05/28/20   Molpus, John, MD  Doxylamine Succinate, Sleep, (SLEEP AID PO) Take 1 tablet by mouth at bedtime.    [provider]  fenofibrate  (TRICOR ) 48 MG tablet Take 48 mg by mouth daily. 06/07/19   [provider]  gabapentin (NEURONTIN) 300 MG capsule Take 300 mg by mouth 2 (two) times daily.    [provider]  meloxicam (MOBIC) 15 MG tablet Take 15 mg by mouth at bedtime.    [provider]  metFORMIN  (GLUCOPHAGE ) 500 MG tablet Take 1 tablet (500 mg total) by mouth 2 (two) times daily with a meal. 06/26/19 08/16/23  Ghimire, Donalda HERO, MD  metoprolol   tartrate (LOPRESSOR ) 25 MG tablet Take 25 mg by mouth in the morning.    [provider]  omeprazole (PRILOSEC) 20 MG capsule Take 20 mg by mouth daily. 05/25/19   [provider]  rosuvastatin (CRESTOR) 20 MG tablet Take 20 mg by mouth daily.    [provider]  cetirizine (ZYRTEC) 5 MG tablet Take 5 mg by mouth daily as needed for allergies.  05/28/20  [provider]    Allergies: Patient has no known allergies.    Review of Systems  Gastrointestinal:  Positive for abdominal pain.    Updated Vital Signs BP 135/81   Pulse 73   Temp 97.8 F (36.6 C) (Oral)   Resp (!) 22   SpO2 98%   Physical Exam Vitals and nursing note reviewed.  Constitutional:      General: He is not in acute distress.    Appearance: He is well-developed.  HENT:     Head: Normocephalic and atraumatic.     Right Ear: External ear normal.     Left Ear: External ear normal.   Eyes:     General: No scleral icterus.       Right eye: No discharge.        Left eye: No discharge.     Conjunctiva/sclera: Conjunctivae normal.   Neck:  Trachea: No tracheal deviation.   Cardiovascular:     Rate and Rhythm: Normal rate and regular rhythm.  Pulmonary:     Effort: Pulmonary effort is normal. No respiratory distress.     Breath sounds: Normal breath sounds. No stridor. No wheezing or rales.  Abdominal:     General: Bowel sounds are normal. There is no distension.     Palpations: Abdomen is soft.     Tenderness: There is no abdominal tenderness. There is no guarding or rebound.  Genitourinary:    Comments: No bright red blood, no mass  Musculoskeletal:        General: No tenderness or deformity.     Cervical back: Neck supple.   Skin:    General: Skin is warm and dry.     Findings: No rash.   Neurological:     General: No focal deficit present.     Mental Status: He is alert.     Cranial Nerves: No cranial nerve deficit, dysarthria or facial asymmetry.      Sensory: No sensory deficit.     Motor: No abnormal muscle tone or seizure activity.     Coordination: Coordination normal.   Psychiatric:        Mood and Affect: Mood normal.     (all labs ordered are listed, but only abnormal results are displayed) Labs Reviewed  COMPREHENSIVE METABOLIC PANEL WITH GFR - Abnormal; Notable for the following components:      Result Value   Glucose, Bld 123 (*)    Calcium 8.6 (*)    Albumin 3.4 (*)    All other components within normal limits  CBC - Abnormal; Notable for the following components:   WBC 11.6 (*)    Hemoglobin 11.8 (*)    HCT 36.1 (*)    All other components within normal limits  URINALYSIS, ROUTINE W REFLEX MICROSCOPIC - Abnormal; Notable for the following components:   Color, Urine STRAW (*)    All other components within normal limits  POC OCCULT BLOOD, ED - Abnormal; Notable for the following components:   Fecal Occult Bld POSITIVE (*)    All other components within normal limits  LIPASE, BLOOD  TYPE AND SCREEN    EKG: EKG Interpretation Date/Time:  Sunday January 15 2024 17:05:30 EDT Ventricular Rate:  70 PR Interval:  188 QRS Duration:  96 QT Interval:  404 QTC Calculation: 436 R Axis:   -8  Text Interpretation: Sinus rhythm Low voltage, precordial leads Confirmed by Randol Simmonds [phone]) on 01/15/2024 5:29:55 PM  Radiology: CT ABDOMEN PELVIS W CONTRAST Result Date: 01/15/2024 CLINICAL DATA:  Abdominal pain and dark stools. EXAM: CT ABDOMEN AND PELVIS WITH CONTRAST TECHNIQUE: Multidetector CT imaging of the abdomen and pelvis was performed using the standard protocol following bolus administration of intravenous contrast. RADIATION DOSE REDUCTION: This exam was performed according to the departmental dose-optimization program which includes automated exposure control, adjustment of the mA and/or kV according to patient size and/or use of iterative reconstruction technique. CONTRAST:  OMNIPAQUE  IOHEXOL  300 MG/ML  SOLN  COMPARISON:  06/30/2019. FINDINGS: Lower chest: No acute abnormality. Hepatobiliary: No focal liver abnormality is seen. Fatty infiltration of the liver is noted. Status post cholecystectomy. No biliary dilatation. Pancreas: Unremarkable. No pancreatic ductal dilatation or surrounding inflammatory changes. Spleen: Normal in size without focal abnormality. Adrenals/Urinary Tract: The adrenal glands are within normal limits. The kidneys enhance symmetrically. No renal or ureteral calculus bilaterally. Extrarenal pelvises are noted bilaterally without evidence of hydronephrosis.  The bladder is unremarkable. Stomach/Bowel: There is thickening of the walls of the gastric antrum/duodenal bulb with surrounding fat stranding. No bowel obstruction, free air, or pneumatosis is seen. A normal appendix is identified in the right lower quadrant. Scattered diverticula are present along the colon without evidence of diverticulitis. Vascular/Lymphatic: Aortic atherosclerosis. No enlarged abdominal or pelvic lymph nodes. 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 The reservoir for a penile implant is noted in the low anterior pelvis. Other: No abdominopelvic ascites. A small fat containing umbilical hernia is noted. Musculoskeletal: Degenerative changes are present in the thoracolumbar spine. No acute osseous abnormality. IMPRESSION: 1. Bowel wall thickening involving the gastric antrum and proximal duodenum with surrounding fat stranding, suggesting gastritis/duodenitis. 2. Diverticulosis without diverticulitis. 3. Hepatic steatosis. 4. Aortic atherosclerosis. Electronically Signed   By: Leita Birmingham M.D.   On: 01/15/2024 19:03     Procedures   Medications Ordered in the ED  morphine  (PF) 4 MG/ML injection 4 mg (4 mg Intravenous Given 01/15/24 1919)  ondansetron  (ZOFRAN ) injection 4 mg (4 mg Intravenous Given 01/15/24 1919)  pantoprazole  (PROTONIX ) injection 40 mg (40 mg Intravenous Given 01/15/24 1919)  iohexol   (OMNIPAQUE ) 300 MG/ML solution 100 mL (100 mLs Intravenous Contrast Given 01/15/24 1845)    Clinical Course as of 01/15/24 2001  Sun Jan 15, 2024  1741 POC occult blood, ED(!) Fecal occult positive [JK]  1815 Hemoglobin decreased compared to baseline.  Metabolic panel without acute abnormalities [JK]  1816 Fecal occult is positive [JK]  1830 Patient initially denied any complaints of pain but now states he is having cramping again.  Will do CT abdomen pelvis.  Will provide pain meds [JK]  1923 CT scan shows evidence of bowel thickening involving the gastric antrum and proximal duodenum.  Probable gastritis duodenitis. [JK]    Clinical Course User Index [JK] Randol Simmonds, MD                                 Medical Decision Making Problems Addressed: Gastrointestinal hemorrhage, unspecified gastrointestinal hemorrhage type: acute illness or injury that poses a threat to life or bodily functions  Amount and/or Complexity of Data Reviewed Labs: ordered. Decision-making details documented in ED Course. Radiology: ordered and independent interpretation performed.  Risk Prescription drug management. Decision regarding hospitalization.   Patient presented to the ED for evaluation of abdominal pain.  Initially patient did not have any abdominal tenderness.  However while he was here he started having an episode of pain again.  Patient's initial labs were notable for a decrease in his hemoglobin.  He was also Hemoccult positive consistent with GI bleeding.  Patient has not had any hypotension in the ER.  His hemoglobin is 11.8.  No indication for transfusion at this time.  CT scan was performed and it does show evidence of gastritis and duodenitis.  Patient has been started on IV Protonix .  He was given medications for pain.  I will consult with the medical service for admission.  Secure chat message to Dr. Elicia was also sent for routine GI consultation     Final diagnoses:   Gastrointestinal hemorrhage, unspecified gastrointestinal hemorrhage type    ED Discharge Orders     None          Randol Simmonds, MD 01/15/24 2001

## 2024-01-16 DIAGNOSIS — I10 Essential (primary) hypertension: Secondary | ICD-10-CM

## 2024-01-16 DIAGNOSIS — K219 Gastro-esophageal reflux disease without esophagitis: Secondary | ICD-10-CM

## 2024-01-16 DIAGNOSIS — E785 Hyperlipidemia, unspecified: Secondary | ICD-10-CM

## 2024-01-16 LAB — IRON AND TIBC
Iron: 59 ug/dL (ref 45–182)
Saturation Ratios: 19 % (ref 17.9–39.5)
TIBC: 304 ug/dL (ref 250–450)
UIBC: 245 ug/dL

## 2024-01-16 LAB — COMPREHENSIVE METABOLIC PANEL WITH GFR
ALT: 15 U/L (ref 0–44)
AST: 17 U/L (ref 15–41)
Albumin: 3.1 g/dL — ABNORMAL LOW (ref 3.5–5.0)
Alkaline Phosphatase: 50 U/L (ref 38–126)
Anion gap: 9 (ref 5–15)
BUN: 10 mg/dL (ref 8–23)
CO2: 21 mmol/L — ABNORMAL LOW (ref 22–32)
Calcium: 8.5 mg/dL — ABNORMAL LOW (ref 8.9–10.3)
Chloride: 109 mmol/L (ref 98–111)
Creatinine, Ser: 0.82 mg/dL (ref 0.61–1.24)
GFR, Estimated: >60 mL/min (ref >60)
Glucose, Bld: 99 mg/dL (ref 70–99)
Potassium: 3.8 mmol/L (ref 3.5–5.1)
Sodium: 139 mmol/L (ref 135–145)
Total Bilirubin: 0.7 mg/dL (ref 0.0–1.2)
Total Protein: 6.1 g/dL — ABNORMAL LOW (ref 6.5–8.1)

## 2024-01-16 LAB — HEMOGLOBIN A1C
Hgb A1c MFr Bld: 5.8 % — ABNORMAL HIGH (ref 4.8–5.6)
Mean Plasma Glucose: 119.76 mg/dL

## 2024-01-16 LAB — VITAMIN B12: Vitamin B-12: 147 pg/mL — ABNORMAL LOW (ref 180–914)

## 2024-01-16 LAB — HEMOGLOBIN
Hemoglobin: 10.9 g/dL — ABNORMAL LOW (ref 13.0–17.0)
Hemoglobin: 10.8 g/dL — ABNORMAL LOW (ref 13.0–17.0)

## 2024-01-16 LAB — FOLATE: Folate: 8.9 ng/mL (ref >5.9)

## 2024-01-16 LAB — FERRITIN: Ferritin: 34 ng/mL (ref 24–336)

## 2024-01-16 LAB — GLUCOSE, CAPILLARY
Glucose-Capillary: 105 mg/dL — ABNORMAL HIGH (ref 70–99)
Glucose-Capillary: 110 mg/dL — ABNORMAL HIGH (ref 70–99)
Glucose-Capillary: 90 mg/dL (ref 70–99)
Glucose-Capillary: 92 mg/dL (ref 70–99)
Glucose-Capillary: 94 mg/dL (ref 70–99)
Glucose-Capillary: 94 mg/dL (ref 70–99)

## 2024-01-16 LAB — CBC
HCT: 35 % — ABNORMAL LOW (ref 39.0–52.0)
Hemoglobin: 11.2 g/dL — ABNORMAL LOW (ref 13.0–17.0)
MCH: 27.2 pg (ref 26.0–34.0)
MCHC: 32 g/dL (ref 30.0–36.0)
MCV: 85 fL (ref 80.0–100.0)
Platelets: 264 10*3/uL (ref 150–400)
RBC: 4.12 MIL/uL — ABNORMAL LOW (ref 4.22–5.81)
RDW: 15 % (ref 11.5–15.5)
WBC: 9.3 10*3/uL (ref 4.0–10.5)
nRBC: 0 % (ref 0.0–0.2)

## 2024-01-16 MED ORDER — ORAL CARE MOUTH RINSE
15.0000 mL | OROMUCOSAL | Status: DC | PRN
Start: 2024-01-16 — End: 2024-01-18

## 2024-01-16 MED ORDER — PANTOPRAZOLE SODIUM 40 MG IV SOLR
40.0000 mg | Freq: Two times a day (BID) | INTRAVENOUS | Status: DC
  Administered 2024-01-16 – 2024-01-18 (×5): 40 mg via INTRAVENOUS
  Filled 2024-01-16 (×5): qty 10

## 2024-01-16 MED ORDER — SODIUM CHLORIDE 0.9 % IV SOLN: INTRAVENOUS | Status: DC

## 2024-01-16 MED ORDER — DICYCLOMINE HCL 20 MG PO TABS: 20.0000 mg | ORAL_TABLET | Freq: Two times a day (BID) | ORAL | Status: DC | PRN

## 2024-01-16 MED ORDER — GABAPENTIN 100 MG PO CAPS
100.0000 mg | ORAL_CAPSULE | Freq: Two times a day (BID) | ORAL | Status: DC
  Administered 2024-01-16 – 2024-01-18 (×5): 100 mg via ORAL
  Filled 2024-01-16 (×5): qty 1

## 2024-01-16 MED ORDER — FENOFIBRATE 54 MG PO TABS
54.0000 mg | ORAL_TABLET | Freq: Every day | ORAL | Status: DC
  Administered 2024-01-16 – 2024-01-18 (×3): 54 mg via ORAL
  Filled 2024-01-16 (×3): qty 1

## 2024-01-16 MED ORDER — SODIUM CHLORIDE 0.9 % IV SOLN: INTRAVENOUS | Status: AC

## 2024-01-16 MED ORDER — ROSUVASTATIN CALCIUM 20 MG PO TABS
20.0000 mg | ORAL_TABLET | Freq: Every day | ORAL | Status: DC
  Administered 2024-01-16 – 2024-01-18 (×3): 20 mg via ORAL
  Filled 2024-01-16 (×3): qty 1

## 2024-01-16 NOTE — H&P (View-Only) (Signed)
 Referring Provider: Dr. Sebastian Primary Care Physician:  [facility] (Inactive) Primary Gastroenterologist:  Michael Steele  Reason for Consultation:  Black stools  HPI: Michael Steele is a 83 y.o. male with 3-4 week history of black stools and diffuse abdominal pain for the past 2 weeks. Abdominal pain worsened in the past 1-2 days. Denies N/V/hematochezia. Denies NSAIDs. Denies alcohol. No history of peptic ulcer disease. CT shows gastric antral wall thickening and duodenal bulb wall thickening. Colonoscopy in 2021 showed diverticulosis and internal hemorrhoids.  Past Medical History:  Diagnosis Date   A-fib (HCC)    COVID-19    GERD (gastroesophageal reflux disease)    Hyperlipidemia    Hypertension     History reviewed. No pertinent surgical history.  Prior to Admission medications   Medication Sig Start Date End Date Taking? Authorizing Provider  amLODipine  (NORVASC ) 2.5 MG tablet Take 2.5 mg by mouth every evening. 06/07/19  Yes [provider]  aspirin  EC 81 MG tablet Take 81 mg by mouth daily. Swallow whole.   Yes [provider]  dicyclomine  (BENTYL ) 20 MG tablet Take 1 tablet (20 mg total) by mouth 2 (two) times daily as needed for spasms. 06/30/19  Yes Dreama Longs, MD  diphenhydrAMINE  (BENADRYL ) 25 MG tablet Take 1 tablet every 4 hours as needed for lip swelling. 05/28/20  Yes Molpus, John, MD  Doxylamine Succinate, Sleep, (SLEEP AID PO) Take 1 tablet by mouth at bedtime as needed (sleep).   Yes [provider]  fenofibrate  (TRICOR ) 48 MG tablet Take 48 mg by mouth daily. 06/07/19  Yes [provider]  gabapentin (NEURONTIN) 100 MG capsule Take 100 mg by mouth 2 (two) times daily.   Yes [provider]  metFORMIN  (GLUCOPHAGE ) 500 MG tablet Take 1 tablet (500 mg total) by mouth 2 (two) times daily with a meal. 06/26/19 01/14/25 Yes Ghimire, Donalda HERO, MD  metoprolol  tartrate (LOPRESSOR ) 25 MG tablet Take 25  mg by mouth in the morning.   Yes [provider]  rosuvastatin (CRESTOR) 20 MG tablet Take 20 mg by mouth daily.   Yes [provider]  tadalafil (CIALIS) 5 MG tablet Take 5 mg by mouth daily. Patient not taking: Reported on 01/15/2024 10/27/23   [provider]  cetirizine (ZYRTEC) 5 MG tablet Take 5 mg by mouth daily as needed for allergies.  05/28/20  [provider]    Scheduled Meds:  fenofibrate   54 mg Oral Daily   gabapentin  100 mg Oral BID   insulin  aspart  0-6 Units Subcutaneous Q4H   pantoprazole  (PROTONIX ) IV  40 mg Intravenous Q12H   rosuvastatin  20 mg Oral Daily   Continuous Infusions:  sodium chloride      PRN Meds:.acetaminophen  **OR** acetaminophen , albuterol , dicyclomine , ondansetron  **OR** ondansetron  (ZOFRAN ) IV  Allergies as of 01/15/2024   (No Known Allergies)    History reviewed. No pertinent family history.  Social History   Socioeconomic History   Marital status: Married    Spouse name: Not on file   Number of children: Not on file   Years of education: Not on file   Highest education level: Not on file  Occupational History   Not on file  Tobacco Use   Smoking status: Never   Smokeless tobacco: Never  Vaping Use   Vaping status: Never Used  Substance and Sexual Activity   Alcohol use: Yes    Comment: socially   Drug use: Never   Sexual activity: Not  Currently  Other Topics Concern   Not on file  Social History Narrative   Not on file   Social Drivers of Health   Financial Resource Strain: Not on file  Food Insecurity: Not on file  Transportation Needs: Not on file  Physical Activity: Not on file  Stress: Not on file  Social Connections: Not on file  Intimate Partner Violence: Not on file    Review of Systems: All negative except as stated above in HPI.  Physical Exam: Vital signs: Vitals:   01/16/24 0422 01/16/24 0930  BP: (!) 94/54 110/60  Pulse: 73   Resp: 16   Temp: (!) 97.5 F (36.4  C)   SpO2: 99%    Last BM Date : 01/15/24 General:   Lethargic, elderly, Well-developed, well-nourished, pleasant and cooperative in NAD Head: normocephalic, atraumatic Eyes: anicteric sclera ENT: oropharynx clear Neck: supple, nontender Lungs:  Clear throughout to auscultation.   No wheezes, crackles, or rhonchi. No acute distress. Heart:  Regular rate and rhythm; no murmurs, clicks, rubs,  or gallops. Abdomen: diffuse tenderness with guarding, soft, nondistended, +BS  Rectal:  Deferred Ext: no edema  GI:  Lab Results: Recent Labs    01/15/24 1711 01/15/24 2337 01/16/24 0515  WBC 11.6*  --  9.3  HGB 11.8* 11.5* 11.2*  HCT 36.1*  --  35.0*  PLT 294  --  264   BMET Recent Labs    01/15/24 1711 01/16/24 0515  NA 136 139  K 3.9 3.8  CL 105 109  CO2 23 21*  GLUCOSE 123* 99  BUN 14 10  CREATININE 0.86 0.82  CALCIUM 8.6* 8.5*   LFT Recent Labs    01/16/24 0515  PROT 6.1*  ALBUMIN 3.1*  AST 17  ALT 15  ALKPHOS 50  BILITOT 0.7   PT/INR No results for input(s): LABPROT, INR in the last 72 hours.   Studies/Results:  Impression/Plan: Black stools with abdominal pain and gastric antral and duodenal bulb wall thickening concerning for peptic ulcer disease. Clear liquid diet today and NPO p MN. EGD tomorrow to further evaluate. IV PPI Q 12 hours. Supportive care. Will follow.    LOS: 1 day   Michael Steele  01/16/2024, 11:35 AM  Questions please call [phone]

## 2024-01-16 NOTE — Consult Note (Signed)
 Referring Provider: Dr. Sebastian Primary Care Physician:  [facility] (Inactive) Primary Gastroenterologist:  Sampson  Reason for Consultation:  Black stools  HPI: Michael Steele is a 83 y.o. male with 3-4 week history of black stools and diffuse abdominal pain for the past 2 weeks. Abdominal pain worsened in the past 1-2 days. Denies N/V/hematochezia. Denies NSAIDs. Denies alcohol. No history of peptic ulcer disease. CT shows gastric antral wall thickening and duodenal bulb wall thickening. Colonoscopy in 2021 showed diverticulosis and internal hemorrhoids.  Past Medical History:  Diagnosis Date   A-fib (HCC)    COVID-19    GERD (gastroesophageal reflux disease)    Hyperlipidemia    Hypertension     History reviewed. No pertinent surgical history.  Prior to Admission medications   Medication Sig Start Date End Date Taking? Authorizing Provider  amLODipine  (NORVASC ) 2.5 MG tablet Take 2.5 mg by mouth every evening. 06/07/19  Yes [provider]  aspirin  EC 81 MG tablet Take 81 mg by mouth daily. Swallow whole.   Yes [provider]  dicyclomine  (BENTYL ) 20 MG tablet Take 1 tablet (20 mg total) by mouth 2 (two) times daily as needed for spasms. 06/30/19  Yes Dreama Longs, MD  diphenhydrAMINE  (BENADRYL ) 25 MG tablet Take 1 tablet every 4 hours as needed for lip swelling. 05/28/20  Yes Molpus, John, MD  Doxylamine Succinate, Sleep, (SLEEP AID PO) Take 1 tablet by mouth at bedtime as needed (sleep).   Yes [provider]  fenofibrate  (TRICOR ) 48 MG tablet Take 48 mg by mouth daily. 06/07/19  Yes [provider]  gabapentin (NEURONTIN) 100 MG capsule Take 100 mg by mouth 2 (two) times daily.   Yes [provider]  metFORMIN  (GLUCOPHAGE ) 500 MG tablet Take 1 tablet (500 mg total) by mouth 2 (two) times daily with a meal. 06/26/19 01/14/25 Yes Ghimire, Donalda HERO, MD  metoprolol  tartrate (LOPRESSOR ) 25 MG tablet Take 25  mg by mouth in the morning.   Yes [provider]  rosuvastatin (CRESTOR) 20 MG tablet Take 20 mg by mouth daily.   Yes [provider]  tadalafil (CIALIS) 5 MG tablet Take 5 mg by mouth daily. Patient not taking: Reported on 01/15/2024 10/27/23   [provider]  cetirizine (ZYRTEC) 5 MG tablet Take 5 mg by mouth daily as needed for allergies.  05/28/20  [provider]    Scheduled Meds:  fenofibrate   54 mg Oral Daily   gabapentin  100 mg Oral BID   insulin  aspart  0-6 Units Subcutaneous Q4H   pantoprazole  (PROTONIX ) IV  40 mg Intravenous Q12H   rosuvastatin  20 mg Oral Daily   Continuous Infusions:  sodium chloride      PRN Meds:.acetaminophen  **OR** acetaminophen , albuterol , dicyclomine , ondansetron  **OR** ondansetron  (ZOFRAN ) IV  Allergies as of 01/15/2024   (No Known Allergies)    History reviewed. No pertinent family history.  Social History   Socioeconomic History   Marital status: Married    Spouse name: Not on file   Number of children: Not on file   Years of education: Not on file   Highest education level: Not on file  Occupational History   Not on file  Tobacco Use   Smoking status: Never   Smokeless tobacco: Never  Vaping Use   Vaping status: Never Used  Substance and Sexual Activity   Alcohol use: Yes    Comment: socially   Drug use: Never   Sexual activity: Not  Currently  Other Topics Concern   Not on file  Social History Narrative   Not on file   Social Drivers of Health   Financial Resource Strain: Not on file  Food Insecurity: Not on file  Transportation Needs: Not on file  Physical Activity: Not on file  Stress: Not on file  Social Connections: Not on file  Intimate Partner Violence: Not on file    Review of Systems: All negative except as stated above in HPI.  Physical Exam: Vital signs: Vitals:   01/16/24 0422 01/16/24 0930  BP: (!) 94/54 110/60  Pulse: 73   Resp: 16   Temp: (!) 97.5 F (36.4  C)   SpO2: 99%    Last BM Date : 01/15/24 General:   Lethargic, elderly, Well-developed, well-nourished, pleasant and cooperative in NAD Head: normocephalic, atraumatic Eyes: anicteric sclera ENT: oropharynx clear Neck: supple, nontender Lungs:  Clear throughout to auscultation.   No wheezes, crackles, or rhonchi. No acute distress. Heart:  Regular rate and rhythm; no murmurs, clicks, rubs,  or gallops. Abdomen: diffuse tenderness with guarding, soft, nondistended, +BS  Rectal:  Deferred Ext: no edema  GI:  Lab Results: Recent Labs    01/15/24 1711 01/15/24 2337 01/16/24 0515  WBC 11.6*  --  9.3  HGB 11.8* 11.5* 11.2*  HCT 36.1*  --  35.0*  PLT 294  --  264   BMET Recent Labs    01/15/24 1711 01/16/24 0515  NA 136 139  K 3.9 3.8  CL 105 109  CO2 23 21*  GLUCOSE 123* 99  BUN 14 10  CREATININE 0.86 0.82  CALCIUM 8.6* 8.5*   LFT Recent Labs    01/16/24 0515  PROT 6.1*  ALBUMIN 3.1*  AST 17  ALT 15  ALKPHOS 50  BILITOT 0.7   PT/INR No results for input(s): LABPROT, INR in the last 72 hours.   Studies/Results:  Impression/Plan: Black stools with abdominal pain and gastric antral and duodenal bulb wall thickening concerning for peptic ulcer disease. Clear liquid diet today and NPO p MN. EGD tomorrow to further evaluate. IV PPI Q 12 hours. Supportive care. Will follow.    LOS: 1 day   [name]  01/16/2024, 11:35 AM  Questions please call [phone]

## 2024-01-16 NOTE — Plan of Care (Signed)
  Problem: Education: Goal: Ability to describe self-care measures that may prevent or decrease complications (Diabetes Survival Skills Education) will improve Outcome: Progressing Goal: Individualized Educational Video(s) Outcome: Progressing   Problem: Coping: Goal: Ability to adjust to condition or change in health will improve Outcome: Progressing   Problem: Fluid Volume: Goal: Ability to maintain a balanced intake and output will improve Outcome: Progressing   Problem: Health Behavior/Discharge Planning: Goal: Ability to identify and utilize available resources and services will improve Outcome: Progressing Goal: Ability to manage health-related needs will improve Outcome: Progressing   Problem: Metabolic: Goal: Ability to maintain appropriate glucose levels will improve Outcome: Progressing   Problem: Nutritional: Goal: Maintenance of adequate nutrition will improve Outcome: Progressing Goal: Progress toward achieving an optimal weight will improve Outcome: Progressing   Problem: Skin Integrity: Goal: Risk for impaired skin integrity will decrease Outcome: Progressing   Problem: Tissue Perfusion: Goal: Adequacy of tissue perfusion will improve Outcome: Progressing   Problem: Education: Goal: Knowledge of General Education information will improve Description: Including pain rating scale, medication(s)/side effects and non-pharmacologic comfort measures Outcome: Progressing   Problem: Health Behavior/Discharge Planning: Goal: Ability to manage health-related needs will improve Outcome: Progressing   Problem: Clinical Measurements: Goal: Ability to maintain clinical measurements within normal limits will improve Outcome: Progressing Goal: Will remain free from infection Outcome: Progressing Goal: Diagnostic test results will improve Outcome: Progressing Goal: Respiratory complications will improve Outcome: Progressing Goal: Cardiovascular complication will  be avoided Outcome: Progressing   Problem: Activity: Goal: Risk for activity intolerance will decrease Outcome: Progressing   Problem: Nutrition: Goal: Adequate nutrition will be maintained Outcome: Progressing   Problem: Coping: Goal: Level of anxiety will decrease Outcome: Progressing   Problem: Pain Managment: Goal: General experience of comfort will improve and/or be controlled Outcome: Progressing   Problem: Safety: Goal: Ability to remain free from injury will improve Outcome: Progressing   Problem: Skin Integrity: Goal: Risk for impaired skin integrity will decrease Outcome: Progressing

## 2024-01-16 NOTE — Anesthesia Preprocedure Evaluation (Signed)
 Anesthesia Evaluation  Patient identified by MRN, date of birth, ID band Patient awake    Reviewed: Allergy & Precautions, NPO status , Patient's Chart, lab work & pertinent test results  Airway Mallampati: II  TM Distance: >3 FB Neck ROM: Full    Dental no notable dental hx. (+) Teeth Intact, Dental Advisory Given   Pulmonary    Pulmonary exam normal breath sounds clear to auscultation       Cardiovascular hypertension, (-) angina + DOE  (-) Past MI Normal cardiovascular exam Rhythm:Regular Rate:Normal     Neuro/Psych negative neurological ROS  negative psych ROS   GI/Hepatic ,GERD  ,,diverticulosis   Endo/Other  diabetes, Type 2, Oral Hypoglycemic Agents    Renal/GU      Musculoskeletal negative musculoskeletal ROS (+)    Abdominal   Peds  Hematology negative hematology ROS (+)   Anesthesia Other Findings   Reproductive/Obstetrics                             Anesthesia Physical Anesthesia Plan  ASA: 3  Anesthesia Plan: MAC   Post-op Pain Management:    Induction:   PONV Risk Score and Plan: 1 and Treatment may vary due to age or medical condition and Propofol infusion  Airway Management Planned: Natural Airway and Nasal Cannula  Additional Equipment: None  Intra-op Plan:   Post-operative Plan:   Informed Consent: I have reviewed the patients History and Physical, chart, labs and discussed the procedure including the risks, benefits and alternatives for the proposed anesthesia with the patient or authorized representative who has indicated his/her understanding and acceptance.     Dental advisory given  Plan Discussed with: CRNA  Anesthesia Plan Comments: (EGD for abdominal pain and abnormal CT)       Anesthesia Quick Evaluation

## 2024-01-16 NOTE — TOC Initial Note (Signed)
 Transition of Care [facility]) - Initial/Assessment Note    Patient Details  Name: Michael Steele MRN: [id number] Date of Birth: December 30, 1940  [facility] CM/SW Contact:    Bascom Service, RN Phone Number: 01/16/2024, 1:09 PM  Clinical Narrative: d/c plan home.                    Barriers to Discharge: Continued Medical Work up   Patient Goals and CMS Choice   CMS Medicare.gov Compare Post Acute Care list provided to:: Patient Choice offered to / list presented to : Patient [place] ownership interest in [facility].provided to:: Patient    Expected Discharge Plan and Services                                              Prior Living Arrangements/Services                       Activities of Daily Living   ADL Screening (condition at time of admission) Independently performs ADLs?: Yes (appropriate for developmental age) Is the patient deaf or have difficulty hearing?: No Does the patient have difficulty seeing, even when wearing glasses/contacts?: No Does the patient have difficulty concentrating, remembering, or making decisions?: No  Permission Sought/Granted                  Emotional Assessment              Admission diagnosis:  GI bleed [K92.2] Gastrointestinal hemorrhage, unspecified gastrointestinal hemorrhage type [K92.2] Patient Active Problem List   Diagnosis Date Noted   GI bleed 01/15/2024   DOE (dyspnea on exertion) 08/16/2023   A-fib (HCC) 06/22/2019   Acute respiratory disease due to COVID-19 virus 06/21/2019   Acute hypoxemic respiratory failure due to COVID-19 [facility]) 06/21/2019   Hyponatremia 06/21/2019   Dehydration 06/21/2019   HTN (hypertension) 06/21/2019   Hyperlipidemia 06/21/2019   GERD (gastroesophageal reflux disease) 06/21/2019   Pneumonia due to COVID-19 virus    PCP:  [facility] (Inactive) Pharmacy:   [facility] [address] [place], [place] -  [address] [place] [address] Phone: [phone] Fax: [phone]     Social Drivers of Health (SDOH) Social History: SDOH Screenings   Tobacco Use: Low Risk  (01/15/2024)   SDOH Interventions:     Readmission Risk Interventions     No data to display

## 2024-01-16 NOTE — Progress Notes (Signed)
 PROGRESS NOTE    Michael Steele  FMW:[id number] DOB: Aug 27, 1940 DOA: 01/15/2024 PCP: [facility] (Inactive)   Chief Complaint  Patient presents with   Abdominal Pain    Brief Narrative:  Patient is a pleasant 83 year old gentleman with history of GERD, hypertension, hyperlipidemia presented to the ED with a 2-week history of upper abdominal pain worsened over the past 24 hours prior to admission with associated melanotic stools.  CT abdomen and pelvis done concerning for gastritis/duodenitis.  CBC with a hemoglobin noted at 11.8.  Patient admitted due to concerns for upper GI bleed in the setting of NSAID use, placed on clear liquids, GI consulted and patient for EGD tomorrow for further evaluation and management.   Assessment & Plan:   Principal Problem:   GI bleed Active Problems:   HTN (hypertension)   Hyperlipidemia   GERD (gastroesophageal reflux disease)  #1 upper abdominal pain/melanotic stools/concern for upper GI bleed -Patient presented with a 2-week history of upper abdominal pain, melanotic stools with upper abdominal pain worsening for 24 hours prior to admission. - CT abdomen and pelvis done with bowel wall thickening involving the gastric antrum and proximal duodenum with surrounding fat stranding suggesting gastritis/duodenitis. - Concern for probable peptic ulcer disease. - Hemoglobin noted at 11.2 this morning. - Check an anemia panel. - Serial CBCs. - Follow H&H. - Continue IV PPI every 12 hours. - Continue clear liquids. -IV fluids. - Seen in consultation by GI who are recommending clear liquids for now, n.p.o. after midnight, EGD tomorrow for further evaluation.  2.  GERD -PPI.  3.  Hyperlipidemia -Resume home regimen Crestor and fenofibrate ..  4.  Hypertension -BP currently soft. - Hold antihypertensive medications. - IV fluids.    DVT prophylaxis: SCDs Code Status: Full Family Communication: Updated patient.  No  family at bedside. Disposition: Likely home when clinically improved and cleared by GI.  Status is: Inpatient Remains inpatient appropriate because: Severity of illness   Consultants:  Gastroenterology: Dr. Dianna 01/16/2024  Procedures:  CT abdomen and pelvis 01/15/2024   Antimicrobials:  Anti-infectives (From admission, onward)    None         Subjective: Patient lying in bed.  Denies any chest pain, no shortness of breath.  Still with abdominal pain but however states improved.  Denies any melanotic stools as has not had any bowel movements yet.  Stated just saw gastroenterologist and plan was for endoscopy tomorrow.  Objective: Vitals:   01/16/24 0104 01/16/24 0422 01/16/24 0930 01/16/24 1226  BP: 117/63 (!) 94/54 110/60 119/63  Pulse: 65 73  68  Resp: 16 16  20   Temp: 97.8 F (36.6 C) (!) 97.5 F (36.4 C)  98.5 F (36.9 C)  TempSrc:  Oral  Oral  SpO2: 98% 99%  97%  Weight:      Height:        Intake/Output Summary (Last 24 hours) at 01/16/2024 1656 Last data filed at 01/16/2024 1500 Gross per 24 hour  Intake 1151.13 ml  Output --  Net 1151.13 ml   Filed Weights   01/15/24 2347  Weight: 49.1 kg    Examination:  General exam: Appears calm and comfortable  Respiratory system: Clear to auscultation.  No wheezes, no crackles, no rhonchi.  Fair air movement.  Speaking in full sentences.  Respiratory effort normal. Cardiovascular system: S1 & S2 heard, RRR. No JVD, murmurs, rubs, gallops or clicks. No pedal edema. Gastrointestinal system: Abdomen is nondistended, soft and nontender. No  organomegaly or masses felt. Normal bowel sounds heard. Central nervous system: Alert and oriented. No focal neurological deficits. Extremities: Symmetric 5 x 5 power. Skin: No rashes, lesions or ulcers Psychiatry: Judgement and insight appear normal. Mood & affect appropriate.     Data Reviewed: I have personally reviewed following labs and imaging studies  CBC: Recent  Labs  Lab 01/15/24 1711 01/15/24 2337 01/16/24 0515 01/16/24 1320  WBC 11.6*  --  9.3  --   HGB 11.8* 11.5* 11.2* 10.9*  HCT 36.1*  --  35.0*  --   MCV 84.1  --  85.0  --   PLT 294  --  264  --     Basic Metabolic Panel: Recent Labs  Lab 01/15/24 1711 01/16/24 0515  NA 136 139  K 3.9 3.8  CL 105 109  CO2 23 21*  GLUCOSE 123* 99  BUN 14 10  CREATININE 0.86 0.82  CALCIUM 8.6* 8.5*    GFR: Estimated Creatinine Clearance: 49.1 mL/min (by C-G formula based on SCr of 0.82 mg/dL).  Liver Function Tests: Recent Labs  Lab 01/15/24 1711 01/16/24 0515  AST 22 17  ALT 17 15  ALKPHOS 58 50  BILITOT 0.6 0.7  PROT 6.9 6.1*  ALBUMIN 3.4* 3.1*    CBG: Recent Labs  Lab 01/15/24 2344 01/16/24 0421 01/16/24 0720 01/16/24 1145 01/16/24 1611  GLUCAP 98 92 105* 94 90     No results found for this or any previous visit (from the past 240 hours).       Radiology Studies: CT ABDOMEN PELVIS W CONTRAST Result Date: 01/15/2024 CLINICAL DATA:  Abdominal pain and dark stools. EXAM: CT ABDOMEN AND PELVIS WITH CONTRAST TECHNIQUE: Multidetector CT imaging of the abdomen and pelvis was performed using the standard protocol following bolus administration of intravenous contrast. RADIATION DOSE REDUCTION: This exam was performed according to the departmental dose-optimization program which includes automated exposure control, adjustment of the mA and/or kV according to patient size and/or use of iterative reconstruction technique. CONTRAST:  OMNIPAQUE  IOHEXOL  300 MG/ML  SOLN COMPARISON:  06/30/2019. FINDINGS: Lower chest: No acute abnormality. Hepatobiliary: No focal liver abnormality is seen. Fatty infiltration of the liver is noted. Status post cholecystectomy. No biliary dilatation. Pancreas: Unremarkable. No pancreatic ductal dilatation or surrounding inflammatory changes. Spleen: Normal in size without focal abnormality. Adrenals/Urinary Tract: The adrenal glands are within  normal limits. The kidneys enhance symmetrically. No renal or ureteral calculus bilaterally. Extrarenal pelvises are noted bilaterally without evidence of hydronephrosis. The bladder is unremarkable. Stomach/Bowel: There is thickening of the walls of the gastric antrum/duodenal bulb with surrounding fat stranding. No bowel obstruction, free air, or pneumatosis is seen. A normal appendix is identified in the right lower quadrant. Scattered diverticula are present along the colon without evidence of diverticulitis. Vascular/Lymphatic: Aortic atherosclerosis. No enlarged abdominal or pelvic lymph nodes. 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 The reservoir for a penile implant is noted in the low anterior pelvis. Other: No abdominopelvic ascites. A small fat containing umbilical hernia is noted. Musculoskeletal: Degenerative changes are present in the thoracolumbar spine. No acute osseous abnormality. IMPRESSION: 1. Bowel wall thickening involving the gastric antrum and proximal duodenum with surrounding fat stranding, suggesting gastritis/duodenitis. 2. Diverticulosis without diverticulitis. 3. Hepatic steatosis. 4. Aortic atherosclerosis. Electronically Signed   By: Leita Birmingham M.D.   On: 01/15/2024 19:03        Scheduled Meds:  fenofibrate   54 mg Oral Daily   gabapentin  100 mg Oral BID  insulin  aspart  0-6 Units Subcutaneous Q4H   pantoprazole  (PROTONIX ) IV  40 mg Intravenous Q12H   rosuvastatin  20 mg Oral Daily   Continuous Infusions:  sodium chloride  100 mL/hr at 01/16/24 1145   sodium chloride        LOS: 1 day    Time spent: 40 minutes    Toribio Hummer, MD [facility]   To contact the attending provider between 7A-7P or the covering provider during after hours 7P-7A, please log into the web site [email or web address] and access using universal [place] password for that web site. If you do not have the password, please call the hospital operator.  01/16/2024, 4:56 PM

## 2024-01-17 ENCOUNTER — Encounter (HOSPITAL_COMMUNITY): Payer: Self-pay | Admitting: Internal Medicine

## 2024-01-17 ENCOUNTER — Inpatient Hospital Stay (HOSPITAL_COMMUNITY): Payer: Self-pay | Admitting: Anesthesiology

## 2024-01-17 ENCOUNTER — Encounter (HOSPITAL_COMMUNITY)
Admission: EM | Disposition: A | Discharge status: Home / Self Care | Payer: Self-pay | Source: Home / Self Care | Attending: Internal Medicine

## 2024-01-17 DIAGNOSIS — K222 Esophageal obstruction: Secondary | ICD-10-CM

## 2024-01-17 DIAGNOSIS — K259 Gastric ulcer, unspecified as acute or chronic, without hemorrhage or perforation: Secondary | ICD-10-CM

## 2024-01-17 DIAGNOSIS — K219 Gastro-esophageal reflux disease without esophagitis: Secondary | ICD-10-CM | POA: Diagnosis not present

## 2024-01-17 DIAGNOSIS — I4891 Unspecified atrial fibrillation: Secondary | ICD-10-CM | POA: Diagnosis not present

## 2024-01-17 DIAGNOSIS — E785 Hyperlipidemia, unspecified: Secondary | ICD-10-CM | POA: Diagnosis not present

## 2024-01-17 DIAGNOSIS — I10 Essential (primary) hypertension: Secondary | ICD-10-CM

## 2024-01-17 DIAGNOSIS — K253 Acute gastric ulcer without hemorrhage or perforation: Secondary | ICD-10-CM

## 2024-01-17 DIAGNOSIS — K269 Duodenal ulcer, unspecified as acute or chronic, without hemorrhage or perforation: Secondary | ICD-10-CM | POA: Diagnosis present

## 2024-01-17 DIAGNOSIS — K921 Melena: Secondary | ICD-10-CM | POA: Diagnosis not present

## 2024-01-17 HISTORY — PX: ESOPHAGOGASTRODUODENOSCOPY: SHX5428

## 2024-01-17 LAB — BASIC METABOLIC PANEL WITH GFR
Anion gap: 11 (ref 5–15)
BUN: 8 mg/dL (ref 8–23)
CO2: 20 mmol/L — ABNORMAL LOW (ref 22–32)
Calcium: 8.5 mg/dL — ABNORMAL LOW (ref 8.9–10.3)
Chloride: 110 mmol/L (ref 98–111)
Creatinine, Ser: 1.01 mg/dL (ref 0.61–1.24)
GFR, Estimated: >60 mL/min (ref >60)
Glucose, Bld: 97 mg/dL (ref 70–99)
Potassium: 3.8 mmol/L (ref 3.5–5.1)
Sodium: 141 mmol/L (ref 135–145)

## 2024-01-17 LAB — CBC WITH DIFFERENTIAL/PLATELET
Abs Immature Granulocytes: 0.09 10*3/uL — ABNORMAL HIGH (ref 0.00–0.07)
Basophils Absolute: 0 10*3/uL (ref 0.0–0.1)
Basophils Relative: 1 %
Eosinophils Absolute: 0.5 10*3/uL (ref 0.0–0.5)
Eosinophils Relative: 7 %
HCT: 35.3 % — ABNORMAL LOW (ref 39.0–52.0)
Hemoglobin: 11.2 g/dL — ABNORMAL LOW (ref 13.0–17.0)
Immature Granulocytes: 1 %
Lymphocytes Relative: 25 %
Lymphs Abs: 2 10*3/uL (ref 0.7–4.0)
MCH: 27.1 pg (ref 26.0–34.0)
MCHC: 31.7 g/dL (ref 30.0–36.0)
MCV: 85.3 fL (ref 80.0–100.0)
Monocytes Absolute: 0.8 10*3/uL (ref 0.1–1.0)
Monocytes Relative: 9 %
Neutro Abs: 4.7 10*3/uL (ref 1.7–7.7)
Neutrophils Relative %: 57 %
Platelets: 264 10*3/uL (ref 150–400)
RBC: 4.14 MIL/uL — ABNORMAL LOW (ref 4.22–5.81)
RDW: 14.8 % (ref 11.5–15.5)
WBC: 8.2 10*3/uL (ref 4.0–10.5)
nRBC: 0 % (ref 0.0–0.2)

## 2024-01-17 LAB — HEMOGLOBIN: Hemoglobin: 11.1 g/dL — ABNORMAL LOW (ref 13.0–17.0)

## 2024-01-17 LAB — GLUCOSE, CAPILLARY
Glucose-Capillary: 107 mg/dL — ABNORMAL HIGH (ref 70–99)
Glucose-Capillary: 184 mg/dL — ABNORMAL HIGH (ref 70–99)
Glucose-Capillary: 81 mg/dL (ref 70–99)

## 2024-01-17 SURGERY — EGD (ESOPHAGOGASTRODUODENOSCOPY)
Anesthesia: Monitor Anesthesia Care

## 2024-01-17 MED ORDER — TRAZODONE HCL 50 MG PO TABS
50.0000 mg | ORAL_TABLET | Freq: Every day | ORAL | Status: DC
  Administered 2024-01-17: 50 mg via ORAL
  Filled 2024-01-17: qty 1

## 2024-01-17 MED ORDER — LIDOCAINE 2% (20 MG/ML) 5 ML SYRINGE
INTRAMUSCULAR | Status: DC | PRN
Start: 2024-01-17 — End: 2024-01-17
  Administered 2024-01-17: 40 mg via INTRAVENOUS

## 2024-01-17 MED ORDER — PROPOFOL 500 MG/50ML IV EMUL
INTRAVENOUS | Status: DC | PRN
  Administered 2024-01-17: 20 mg via INTRAVENOUS
  Administered 2024-01-17: 180 ug/kg/min via INTRAVENOUS
  Administered 2024-01-17 (×2): 30 mg via INTRAVENOUS

## 2024-01-17 NOTE — Progress Notes (Signed)
 PROGRESS NOTE    Michael Steele  FMW:[id number] DOB: 23-Dec-1940 DOA: 01/15/2024 PCP: [facility] (Inactive)   Chief Complaint  Patient presents with   Abdominal Pain    Brief Narrative:  Patient is a pleasant 83 year old gentleman with history of GERD, hypertension, hyperlipidemia presented to the ED with a 2-week history of upper abdominal pain worsened over the past 24 hours prior to admission with associated melanotic stools.  CT abdomen and pelvis done concerning for gastritis/duodenitis.  CBC with a hemoglobin noted at 11.8.  Patient admitted due to concerns for upper GI bleed in the setting of NSAID use, placed on clear liquids, GI consulted and patient underwent EGD 01/17/2024 which showed nonbleeding gastric ulcer and nonbleeding duodenal ulcers.    Assessment & Plan:   Principal Problem:   GI bleed Active Problems:   Gastric ulcer   Duodenal ulcer   HTN (hypertension)   Hyperlipidemia   GERD (gastroesophageal reflux disease)  #1 upper abdominal pain/melanotic stools/concern for upper GI bleed secondary to gastric ulcer and duodenal ulcers noted on EGD -Patient presented with a 2-week history of upper abdominal pain, melanotic stools with upper abdominal pain worsening for 24 hours prior to admission. - CT abdomen and pelvis done with bowel wall thickening involving the gastric antrum and proximal duodenum with surrounding fat stranding suggesting gastritis/duodenitis. - Concern for probable peptic ulcer disease. - Hemoglobin stable at 11.2.  - Anemia panel with iron level of 59, TIBC of 304, ferritin of 34, folate of 8.9.   - Seen in consultation by GI underwent upper endoscopy, today 01/17/2024, which showed a widely patent and nonobstructing Schatzki's ring, nonbleeding gastric ulcer with no stigmata of bleeding, congested erythematous eroded and ulcerated mucosa in the gastric body and antrum which were biopsied.  Nonbleeding duodenal ulcers  with no stigmata of bleeding.  Mucosal changes in the duodenum biopsied.  Normal second portion of duodenum.   - Patient placed on clear liquid diet.  GI recommending continuation of PPI 40 mg IV twice daily.   - Serial CBCs.   - GI following and I appreciate their input and recommendations.    2.  GERD - Continue IV PPI every 12 hours.  3.  Hyperlipidemia - Continue home regimen Crestor and fenofibrate ..  4.  Hypertension -BP currently soft. -Continue to hold antihypertensive medications.    DVT prophylaxis: SCDs Code Status: Full Family Communication: Updated patient.  No family at bedside. Disposition: Likely home when clinically improved and cleared by GI hopefully in the next 24 to 48 hours.  Status is: Inpatient Remains inpatient appropriate because: Severity of illness   Consultants:  Gastroenterology: Dr. Dianna 01/16/2024  Procedures:  CT abdomen and pelvis 01/15/2024 Upper endoscopy: Dr. Dianna 01/17/2024  Antimicrobials:  Anti-infectives (From admission, onward)    None         Subjective: Patient lying in bed.  Noted to have returned from a EGD this morning.  States upper abdominal pain has improved.  Noted to have melanotic stools overnight.  Denies any chest pain or shortness of breath.   Objective: Vitals:   01/17/24 0947 01/17/24 0950 01/17/24 1000 01/17/24 1305  BP: (!) 109/59 110/64 119/74 121/80  Pulse: 86 83 88 96  Resp: 17 16 19 20   Temp: 98 F (36.7 C)   97.7 F (36.5 C)  TempSrc: Temporal   Oral  SpO2: 100% 100% 96% 99%  Weight:      Height:  Intake/Output Summary (Last 24 hours) at 01/17/2024 1548 Last data filed at 01/17/2024 1254 Gross per 24 hour  Intake 2689.49 ml  Output --  Net 2689.49 ml   Filed Weights   01/15/24 2347 01/17/24 0905  Weight: 49.1 kg 49.1 kg    Examination:  General exam: NAD Respiratory system: CTAB.  No wheezes, no crackles, no rhonchi.  Fair air movement.  Speaking in full sentences.   Cardiovascular system: Regular rate rhythm no murmurs rubs or gallops.  No JVD.  No lower extremity edema.  Gastrointestinal system: Abdomen is soft, nontender, nondistended, positive bowel sounds.  No rebound.  No guarding. Central nervous system: Alert and oriented. No focal neurological deficits. Extremities: Symmetric 5 x 5 power. Skin: No rashes, lesions or ulcers Psychiatry: Judgement and insight appear normal. Mood & affect appropriate.     Data Reviewed: I have personally reviewed following labs and imaging studies  CBC: Recent Labs  Lab 01/15/24 1711 01/15/24 2337 01/16/24 0515 01/16/24 1320 01/16/24 1816 01/17/24 0634  WBC 11.6*  --  9.3  --   --  8.2  NEUTROABS  --   --   --   --   --  4.7  HGB 11.8* 11.5* 11.2* 10.9* 10.8* 11.2*  HCT 36.1*  --  35.0*  --   --  35.3*  MCV 84.1  --  85.0  --   --  85.3  PLT 294  --  264  --   --  264    Basic Metabolic Panel: Recent Labs  Lab 01/15/24 1711 01/16/24 0515 01/17/24 0634  NA 136 139 141  K 3.9 3.8 3.8  CL 105 109 110  CO2 23 21* 20*  GLUCOSE 123* 99 97  BUN 14 10 8   CREATININE 0.86 0.82 1.01  CALCIUM 8.6* 8.5* 8.5*    GFR: Estimated Creatinine Clearance: 39.8 mL/min (by C-G formula based on SCr of 1.01 mg/dL).  Liver Function Tests: Recent Labs  Lab 01/15/24 1711 01/16/24 0515  AST 22 17  ALT 17 15  ALKPHOS 58 50  BILITOT 0.6 0.7  PROT 6.9 6.1*  ALBUMIN 3.4* 3.1*    CBG: Recent Labs  Lab 01/16/24 1611 01/16/24 1955 01/16/24 2350 01/17/24 0737 01/17/24 1142  GLUCAP 90 110* 94 107* 184*     No results found for this or any previous visit (from the past 240 hours).       Radiology Studies: CT ABDOMEN PELVIS W CONTRAST Result Date: 01/15/2024 CLINICAL DATA:  Abdominal pain and dark stools. EXAM: CT ABDOMEN AND PELVIS WITH CONTRAST TECHNIQUE: Multidetector CT imaging of the abdomen and pelvis was performed using the standard protocol following bolus administration of intravenous  contrast. RADIATION DOSE REDUCTION: This exam was performed according to the departmental dose-optimization program which includes automated exposure control, adjustment of the mA and/or kV according to patient size and/or use of iterative reconstruction technique. CONTRAST:  100mL OMNIPAQUE  IOHEXOL  300 MG/ML  SOLN COMPARISON:  06/30/2019. FINDINGS: Lower chest: No acute abnormality. Hepatobiliary: No focal liver abnormality is seen. Fatty infiltration of the liver is noted. Status post cholecystectomy. No biliary dilatation. Pancreas: Unremarkable. No pancreatic ductal dilatation or surrounding inflammatory changes. Spleen: Normal in size without focal abnormality. Adrenals/Urinary Tract: The adrenal glands are within normal limits. The kidneys enhance symmetrically. No renal or ureteral calculus bilaterally. Extrarenal pelvises are noted bilaterally without evidence of hydronephrosis. The bladder is unremarkable. Stomach/Bowel: There is thickening of the walls of the gastric antrum/duodenal bulb with surrounding fat stranding. No  bowel obstruction, free air, or pneumatosis is seen. A normal appendix is identified in the right lower quadrant. Scattered diverticula are present along the colon without evidence of diverticulitis. Vascular/Lymphatic: Aortic atherosclerosis. No enlarged abdominal or pelvic lymph nodes. 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 The reservoir for a penile implant is noted in the low anterior pelvis. Other: No abdominopelvic ascites. A small fat containing umbilical hernia is noted. Musculoskeletal: Degenerative changes are present in the thoracolumbar spine. No acute osseous abnormality. IMPRESSION: 1. Bowel wall thickening involving the gastric antrum and proximal duodenum with surrounding fat stranding, suggesting gastritis/duodenitis. 2. Diverticulosis without diverticulitis. 3. Hepatic steatosis. 4. Aortic atherosclerosis. Electronically Signed   By: Leita Birmingham M.D.   On:  01/15/2024 19:03        Scheduled Meds:  fenofibrate   54 mg Oral Daily   gabapentin  100 mg Oral BID   insulin  aspart  0-6 Units Subcutaneous Q4H   pantoprazole  (PROTONIX ) IV  40 mg Intravenous Q12H   rosuvastatin  20 mg Oral Daily   Continuous Infusions:     LOS: 2 days    Time spent: 40 minutes    Toribio Hummer, MD [facility]   To contact the attending provider between 7A-7P or the covering provider during after hours 7P-7A, please log into the web site [email or web address] and access using universal [place] password for that web site. If you do not have the password, please call the hospital operator.  01/17/2024, 3:48 PM

## 2024-01-17 NOTE — Op Note (Signed)
 [facility] Patient Name: Michael Steele Procedure Date: 01/17/2024 MRN: [id number] Attending MD: [name] , MD, [id number] Date of Birth: 1940/12/11 CSN: [id number] Age: 83 Admit Type: Inpatient Procedure:                Upper GI endoscopy Indications:              Melena Providers:                Jerrell KYM Sol, MD, Willy Hummer, RN,                            Coye Bade, Technician Referring MD:             hospital team Medicines:                Propofol per Anesthesia, Monitored Anesthesia Care Complications:            No immediate complications. Estimated Blood Loss:     Estimated blood loss was minimal. Procedure:                Pre-Anesthesia Assessment:                           - Prior to the procedure, a History and Physical                            was performed, and patient medications and                            allergies were reviewed. The patient's tolerance of                            previous anesthesia was also reviewed. The risks                            and benefits of the procedure and the sedation                            options and risks were discussed with the patient.                            All questions were answered, and informed consent                            was obtained. Prior Anticoagulants: The patient has                            taken no anticoagulant or antiplatelet agents. ASA                            Grade Assessment: III - A patient with severe                            systemic disease. After reviewing the risks and  benefits, the patient was deemed in satisfactory                            condition to undergo the procedure.                           After obtaining informed consent, the endoscope was                            passed under direct vision. Throughout the                            procedure, the patient's blood pressure, pulse, and                             oxygen saturations were monitored continuously. The                            [id number]-[id number] ([id number]) Olympus endoscope was introduced                            through the mouth, and advanced to the second part                            of duodenum. The upper GI endoscopy was                            accomplished without difficulty. The patient                            tolerated the procedure well. Scope In: Scope Out: Findings:      The Z-line was regular and was found 40 cm from the incisors.      A widely patent and non-obstructing Schatzki ring was found at the       gastroesophageal junction.      One non-bleeding cratered gastric ulcer with no stigmata of bleeding was       found in the gastric antrum. The lesion was 8 mm in largest dimension.      Segmental moderate mucosal changes characterized by congestion,       erythema, erosion and ulceration were found in the gastric body and in       the gastric antrum. Biopsies were taken with a cold forceps for       histology. Estimated blood loss was minimal.      The cardia and gastric fundus were normal on retroflexion.      Two non-bleeding cratered duodenal ulcers with no stigmata of bleeding       were found in the duodenal bulb. The largest lesion was 15 mm in largest       dimension.      Segmental severe mucosal changes characterized by congestion, erosion       and inflammation were found in the duodenal bulb. Biopsies were taken       with a cold forceps for histology. Estimated blood loss was minimal.      The second portion of the duodenum was normal. Impression:               -  Z-line regular, 40 cm from the incisors.                           - Widely patent and non-obstructing Schatzki ring.                           - Non-bleeding gastric ulcer with no stigmata of                            bleeding.                           - Congested, erythematous, eroded and ulcerated                             mucosa in the gastric body and antrum. Biopsied.                           - Non-bleeding duodenal ulcers with no stigmata of                            bleeding.                           - Mucosal changes in the duodenum. Biopsied.                           - Normal second portion of the duodenum. Moderate Sedation:      N/A - MAC procedure Recommendation:           - Clear liquid diet.                           - Observe patient's clinical course.                           - Await pathology results.                           - Use Protonix  (pantoprazole ) 40 mg IV BID. Procedure Code(s):        --- Professional ---                           [phone], Esophagogastroduodenoscopy, flexible,                            transoral; with biopsy, single or multiple Diagnosis Code(s):        --- Professional ---                           K92.1, Melena (includes Hematochezia)                           K25.9, Gastric ulcer, unspecified as acute or                            chronic, without hemorrhage or perforation  K26.9, Duodenal ulcer, unspecified as acute or                            chronic, without hemorrhage or perforation                           K22.2, Esophageal obstruction                           K31.89, Other diseases of stomach and duodenum CPT copyright 2022 [employer]. All rights reserved. The codes documented in this report are preliminary and upon coder review may  be revised to meet current compliance requirements. [name], MD 01/17/2024 9:56:44 AM This report has been signed electronically. Number of Addenda: 0

## 2024-01-17 NOTE — Plan of Care (Signed)
  Problem: Education: Goal: Ability to describe self-care measures that may prevent or decrease complications (Diabetes Survival Skills Education) will improve 01/17/2024 2050 by Maleke Feria K, RN Outcome: Progressing 01/17/2024 2044 by Lanisha Stepanian K, RN Outcome: Progressing Goal: Individualized Educational Video(s) 01/17/2024 2050 by Tipton Ballow K, RN Outcome: Progressing 01/17/2024 2044 by Tylicia Sherman K, RN Outcome: Progressing   Problem: Coping: Goal: Ability to adjust to condition or change in health will improve 01/17/2024 2050 by Madalyn Legner K, RN Outcome: Progressing 01/17/2024 2044 by Harmon Bommarito K, RN Outcome: Progressing

## 2024-01-17 NOTE — Interval H&P Note (Signed)
 History and Physical Interval Note:  01/17/2024 9:26 AM  Michael Steele  has presented today for surgery, with the diagnosis of Abdominal pain; Abnormal CT scan.  The various methods of treatment have been discussed with the patient and family. After consideration of risks, benefits and other options for treatment, the patient has consented to  Procedure(s): EGD (ESOPHAGOGASTRODUODENOSCOPY) (N/A) as a surgical intervention.  The patient's history has been reviewed, patient examined, no change in status, stable for surgery.  I have reviewed the patient's chart and labs.  Questions were answered to the patient's satisfaction.     [name]

## 2024-01-17 NOTE — Plan of Care (Signed)
  Problem: Clinical Measurements: Goal: Ability to maintain clinical measurements within normal limits will improve Outcome: Progressing Goal: Diagnostic test results will improve Outcome: Progressing   Problem: Nutrition: Goal: Adequate nutrition will be maintained Outcome: Progressing   

## 2024-01-17 NOTE — Transfer of Care (Signed)
 Immediate Anesthesia Transfer of Care Note  Patient: Alieu Al Rifaie  Procedure(s) Performed: EGD (ESOPHAGOGASTRODUODENOSCOPY)  Patient Location: PACU  Anesthesia Type:MAC  Level of Consciousness: sedated  Airway & Oxygen Therapy: Patient Spontanous Breathing  Post-op Assessment: Report given to RN  Post vital signs: Reviewed and stable  Last Vitals:  Vitals Value Taken Time  BP 109/59 01/17/24 09:47  Temp 36.7 C 01/17/24 09:47  Pulse 83 01/17/24 09:50  Resp 16 01/17/24 09:50  SpO2 100 % 01/17/24 09:50  Vitals shown include unfiled device data.  Last Pain:  Vitals:   01/17/24 0947  TempSrc: Temporal  PainSc: 0-No pain      Patients Stated Pain Goal: 2 (01/16/24 0836)  Complications: No notable events documented.

## 2024-01-17 NOTE — Anesthesia Postprocedure Evaluation (Signed)
 Anesthesia Post Note  Patient: Michael Steele  Procedure(s) Performed: EGD (ESOPHAGOGASTRODUODENOSCOPY)     Patient location during evaluation: Endoscopy Anesthesia Type: MAC Level of consciousness: awake and alert Pain management: pain level controlled Vital Signs Assessment: post-procedure vital signs reviewed and stable Respiratory status: spontaneous breathing, nonlabored ventilation, respiratory function stable and patient connected to nasal cannula oxygen Cardiovascular status: blood pressure returned to baseline and stable Postop Assessment: no apparent nausea or vomiting Anesthetic complications: no  No notable events documented.  Last Vitals:  Vitals:   01/17/24 0950 01/17/24 1000  BP: 110/64 119/74  Pulse: 83 88  Resp: 16 19  Temp:    SpO2: 100% 96%    Last Pain:  Vitals:   01/17/24 1000  TempSrc:   PainSc: 0-No pain                 Garnette DELENA Gab

## 2024-01-17 NOTE — Plan of Care (Signed)
   Problem: Education: Goal: Ability to describe self-care measures that may prevent or decrease complications (Diabetes Survival Skills Education) will improve Outcome: Progressing Goal: Individualized Educational Video(s) Outcome: Progressing   Problem: Coping: Goal: Ability to adjust to condition or change in health will improve Outcome: Progressing

## 2024-01-18 DIAGNOSIS — K922 Gastrointestinal hemorrhage, unspecified: Secondary | ICD-10-CM | POA: Diagnosis not present

## 2024-01-18 LAB — GLUCOSE, CAPILLARY
Glucose-Capillary: 100 mg/dL — ABNORMAL HIGH (ref 70–99)
Glucose-Capillary: 129 mg/dL — ABNORMAL HIGH (ref 70–99)
Glucose-Capillary: 91 mg/dL (ref 70–99)
Glucose-Capillary: 99 mg/dL (ref 70–99)

## 2024-01-18 MED ORDER — PANTOPRAZOLE SODIUM 40 MG PO TBEC: 40.0000 mg | DELAYED_RELEASE_TABLET | Freq: Two times a day (BID) | ORAL | 0 refills | Status: AC

## 2024-01-18 NOTE — Progress Notes (Signed)
 [facility] Gastroenterology Progress Note  Michael Steele 83 y.o. 1941-01-06   Subjective: Feels good. Wants to go home. Called daughter on speaker during my interaction who helped with translation.  Objective: Vital signs: Vitals:   01/17/24 2031 01/18/24 1225  BP: (!) 147/78 127/70  Pulse: 77 79  Resp: 15 20  Temp: (!) 97.4 F (36.3 C) 98.8 F (37.1 C)  SpO2: 97% 100%    Physical Exam: Gen: alert, no acute distress, elderly, pleasant  HEENT: anicteric sclera CV: RRR Chest: CTA B Abd: soft, nontender, nondistended, +BS Ext: no edema  Lab Results: Recent Labs    01/16/24 0515 01/17/24 0634  NA 139 141  K 3.8 3.8  CL 109 110  CO2 21* 20*  GLUCOSE 99 97  BUN 10 8  CREATININE 0.82 1.01  CALCIUM 8.5* 8.5*   Recent Labs    01/15/24 1711 01/16/24 0515  AST 22 17  ALT 17 15  ALKPHOS 58 50  BILITOT 0.6 0.7  PROT 6.9 6.1*  ALBUMIN 3.4* 3.1*   Recent Labs    01/16/24 0515 01/16/24 1320 01/17/24 0634 01/17/24 1730  WBC 9.3  --  8.2  --   NEUTROABS  --   --  4.7  --   HGB 11.2*   < > 11.2* 11.1*  HCT 35.0*  --  35.3*  --   MCV 85.0  --  85.3  --   PLT 264  --  264  --    < > = values in this interval not displayed.      Assessment/Plan: Peptic ulcer bleed - no signs of further bleeding. Hgb stable. Ok to change to PPI PO BID and d/c home. Needs to be on low fat diet for 2 days and then heart healthy. Will f/u on final path for H. Pylori as outpt. D/W Dr. Bryn.   Michael Steele 01/18/2024, 4:32 PM  Questions please call [phone] ID: Michael Steele, male   DOB: [birth year], 83 y.o.   MRN: [id number]

## 2024-01-18 NOTE — Discharge Summary (Signed)
 Physician Discharge Summary   Patient: Michael Steele MRN: [id number] DOB: 1940-12-17  Admit date:     01/15/2024  Discharge date: {dischdate:26783}  Discharge Physician: Bernardino KATHEE Come   PCP: [facility] (Inactive)   Recommendations at discharge:  {Tip this will not be part of the note when signed- Example include specific recommendations for outpatient follow-up, pending tests to follow-up on. (Optional):26781}  ***  Discharge Diagnoses: Principal Problem:   GI bleed Active Problems:   Gastric ulcer   Duodenal ulcer   HTN (hypertension)   Hyperlipidemia   GERD (gastroesophageal reflux disease)  Resolved Problems:   * No resolved hospital problems. [facility] Course: No notes on file  Assessment and Plan: No notes have been filed under this hospital service. Service: Hospitalist     {Tip this will not be part of the note when signed Body mass index is 18.01 kg/m. , ,  (Optional):26781}  {(NOTE) Pain control PDMP Statment (Optional):26782} Consultants: *** Procedures performed: ***  Disposition: {Plan; Disposition:26390} Diet recommendation:  {Diet_Plan:26776} DISCHARGE MEDICATION: Allergies as of 01/18/2024   No Known Allergies      Medication List     TAKE these medications    amLODipine  2.5 MG tablet Commonly known as: NORVASC  Take 2.5 mg by mouth every evening.   aspirin  EC 81 MG tablet Take 81 mg by mouth daily. Swallow whole.   dicyclomine  20 MG tablet Commonly known as: BENTYL  Take 1 tablet (20 mg total) by mouth 2 (two) times daily as needed for spasms.   diphenhydrAMINE  25 MG tablet Commonly known as: BENADRYL  Take 1 tablet every 4 hours as needed for lip swelling.   fenofibrate  48 MG tablet Commonly known as: TRICOR  Take 48 mg by mouth daily.   gabapentin 100 MG capsule Commonly known as: NEURONTIN Take 100 mg by mouth 2 (two) times daily.   metFORMIN  500 MG tablet Commonly known as: Glucophage  Take  1 tablet (500 mg total) by mouth 2 (two) times daily with a meal.   metoprolol  tartrate 25 MG tablet Commonly known as: LOPRESSOR  Take 25 mg by mouth in the morning.   pantoprazole  40 MG tablet Commonly known as: Protonix  Take 1 tablet (40 mg total) by mouth 2 (two) times daily.   rosuvastatin 20 MG tablet Commonly known as: CRESTOR Take 20 mg by mouth daily.   SLEEP AID PO Take 1 tablet by mouth at bedtime as needed (sleep).   tadalafil 5 MG tablet Commonly known as: CIALIS Take 5 mg by mouth daily.        Follow-up Information     [facility] Follow up.   Contact information: [address] [place] [address] [phone]         Dianna Specking, MD Follow up.   Specialty: Gastroenterology Contact information: [address] [place] [address] [phone]                Discharge Exam: Michael Steele   01/15/24 2347 01/17/24 0905  Weight: 49.1 kg 49.1 kg   ***  Condition at discharge: {DC Condition:26389}  The results of significant diagnostics from this hospitalization (including imaging, microbiology, ancillary and laboratory) are listed below for reference.   Imaging Studies: CT ABDOMEN PELVIS W CONTRAST Result Date: 01/15/2024 CLINICAL DATA:  Abdominal pain and dark stools. EXAM: CT ABDOMEN AND PELVIS WITH CONTRAST TECHNIQUE: Multidetector CT imaging of the abdomen and pelvis was performed using  the standard protocol following bolus administration of intravenous contrast. RADIATION DOSE REDUCTION: This exam was performed according to the departmental dose-optimization program which includes automated exposure control, adjustment of the mA and/or kV according to patient size and/or use of iterative reconstruction technique. CONTRAST:  OMNIPAQUE  IOHEXOL  300 MG/ML  SOLN COMPARISON:  06/30/2019. FINDINGS: Lower chest: No acute abnormality. Hepatobiliary: No focal liver abnormality is seen.  Fatty infiltration of the liver is noted. Status post cholecystectomy. No biliary dilatation. Pancreas: Unremarkable. No pancreatic ductal dilatation or surrounding inflammatory changes. Spleen: Normal in size without focal abnormality. Adrenals/Urinary Tract: The adrenal glands are within normal limits. The kidneys enhance symmetrically. No renal or ureteral calculus bilaterally. Extrarenal pelvises are noted bilaterally without evidence of hydronephrosis. The bladder is unremarkable. Stomach/Bowel: There is thickening of the walls of the gastric antrum/duodenal bulb with surrounding fat stranding. No bowel obstruction, free air, or pneumatosis is seen. A normal appendix is identified in the right lower quadrant. Scattered diverticula are present along the colon without evidence of diverticulitis. Vascular/Lymphatic: Aortic atherosclerosis. No enlarged abdominal or pelvic lymph nodes. 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 The reservoir for a penile implant is noted in the low anterior pelvis. Other: No abdominopelvic ascites. A small fat containing umbilical hernia is noted. Musculoskeletal: Degenerative changes are present in the thoracolumbar spine. No acute osseous abnormality. IMPRESSION: 1. Bowel wall thickening involving the gastric antrum and proximal duodenum with surrounding fat stranding, suggesting gastritis/duodenitis. 2. Diverticulosis without diverticulitis. 3. Hepatic steatosis. 4. Aortic atherosclerosis. Electronically Signed   By: Leita Birmingham M.D.   On: 01/15/2024 19:03    Microbiology: Results for orders placed or performed in visit on 06/03/20  Novel Coronavirus, NAA ([facility])     Status: None   Collection Time: 06/03/20 11:06 AM   Specimen: Nasopharyngeal(NP) swabs in vial transport medium   Nasopharynge  Screenin  Result Value Ref Range Status   SARS-CoV-2, NAA Not Detected Not Detected Final    Comment: This nucleic acid amplification test was developed and its  performance characteristics determined by [employer]. Nucleic acid amplification tests include RT-PCR and TMA. This test has not been FDA cleared or approved. This test has been authorized by FDA under an Emergency Use Authorization (EUA). This test is only authorized for the duration of time the declaration that circumstances exist justifying the authorization of the emergency use of in vitro diagnostic tests for detection of SARS-CoV-2 virus and/or diagnosis of COVID-19 infection under section 564(b)(1) of the Act, 21 U.S.C. 639aaa-6(a) (1), unless the authorization is terminated or revoked sooner. When diagnostic testing is negative, the possibility of a false negative result should be considered in the context of a patient's recent exposures and the presence of clinical signs and symptoms consistent with COVID-19. An individual without symptoms of COVID-19 and who is not shedding SARS-CoV-2 virus wo uld expect to have a negative (not detected) result in this assay.   SARS-COV-2, NAA 2 DAY TAT     Status: None   Collection Time: 06/03/20 11:06 AM   Nasopharynge  Screenin  Result Value Ref Range Status   SARS-CoV-2, NAA 2 DAY TAT Performed  Final    Labs: CBC: Recent Labs  Lab 01/15/24 1711 01/15/24 2337 01/16/24 0515 01/16/24 1320 01/16/24 1816 01/17/24 0634 01/17/24 1730  WBC 11.6*  --  9.3  --   --  8.2  --   NEUTROABS  --   --   --   --   --  4.7  --  HGB 11.8*   < > 11.2* 10.9* 10.8* 11.2* 11.1*  HCT 36.1*  --  35.0*  --   --  35.3*  --   MCV 84.1  --  85.0  --   --  85.3  --   PLT 294  --  264  --   --  264  --    < > = values in this interval not displayed.   Basic Metabolic Panel: Recent Labs  Lab 01/15/24 1711 01/16/24 0515 01/17/24 0634  NA 136 139 141  K 3.9 3.8 3.8  CL 105 109 110  CO2 23 21* 20*  GLUCOSE 123* 99 97  BUN 14 10 8   CREATININE 0.86 0.82 1.01  CALCIUM 8.6* 8.5* 8.5*   Liver Function Tests: Recent Labs  Lab  01/15/24 1711 01/16/24 0515  AST 22 17  ALT 17 15  ALKPHOS 58 50  BILITOT 0.6 0.7  PROT 6.9 6.1*  ALBUMIN 3.4* 3.1*   CBG: Recent Labs  Lab 01/17/24 1840 01/18/24 0020 01/18/24 0859 01/18/24 1149 01/18/24 1605  GLUCAP 81 100* 129* 91 99    Discharge time spent: {LESS THAN/GREATER THAN:26388} 30 minutes.  Signed: Bernardino KATHEE Come, MD [facility] 01/18/2024

## 2024-01-18 NOTE — Progress Notes (Signed)
 Pt sleep well last night , refused blood sugar check and lab work this morning, education provided, walked in the room and to the bathroom multiple times. Asleep now , will continue to monitor

## 2024-01-18 NOTE — Plan of Care (Signed)
  Problem: Education: Goal: Ability to describe self-care measures that may prevent or decrease complications (Diabetes Survival Skills Education) will improve Outcome: Progressing Goal: Individualized Educational Video(s) Outcome: Progressing   Problem: Coping: Goal: Ability to adjust to condition or change in health will improve Outcome: Progressing   Problem: Fluid Volume: Goal: Ability to maintain a balanced intake and output will improve Outcome: Progressing   Problem: Health Behavior/Discharge Planning: Goal: Ability to identify and utilize available resources and services will improve Outcome: Progressing Goal: Ability to manage health-related needs will improve Outcome: Progressing   Problem: Metabolic: Goal: Ability to maintain appropriate glucose levels will improve Outcome: Progressing   Problem: Nutritional: Goal: Maintenance of adequate nutrition will improve Outcome: Progressing Goal: Progress toward achieving an optimal weight will improve Outcome: Progressing   Problem: Skin Integrity: Goal: Risk for impaired skin integrity will decrease Outcome: Progressing   Problem: Tissue Perfusion: Goal: Adequacy of tissue perfusion will improve Outcome: Progressing   Problem: Clinical Measurements: Goal: Ability to maintain clinical measurements within normal limits will improve Outcome: Progressing Goal: Will remain free from infection Outcome: Progressing Goal: Diagnostic test results will improve Outcome: Progressing   Problem: Nutrition: Goal: Adequate nutrition will be maintained Outcome: Progressing

## 2024-01-19 ENCOUNTER — Encounter (HOSPITAL_COMMUNITY): Payer: Self-pay | Admitting: Gastroenterology

## 2024-01-19 LAB — SURGICAL PATHOLOGY
